# Patient Record
Sex: Female | Born: 1990 | Race: Black or African American | Hispanic: No | Marital: Single | Smoking: Never smoker
Health system: Southern US, Community
[De-identification: ages and names within clinical notes are randomized; demographics above are authoritative.]

## PROBLEM LIST (undated history)

## (undated) DIAGNOSIS — D649 Anemia, unspecified: Secondary | ICD-10-CM

## (undated) DIAGNOSIS — K429 Umbilical hernia without obstruction or gangrene: Secondary | ICD-10-CM

## (undated) DIAGNOSIS — Z789 Other specified health status: Secondary | ICD-10-CM

## (undated) DIAGNOSIS — L259 Unspecified contact dermatitis, unspecified cause: Secondary | ICD-10-CM

## (undated) HISTORY — DX: Unspecified contact dermatitis, unspecified cause: L25.9

## (undated) HISTORY — PX: OTHER SURGICAL HISTORY: SHX169

## (undated) HISTORY — DX: Other specified health status: Z78.9

## (undated) HISTORY — DX: Anemia, unspecified: D64.9

---
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
## 2008-02-25 ENCOUNTER — Ambulatory Visit: Payer: Self-pay | Admitting: Family Medicine

## 2008-03-15 ENCOUNTER — Ambulatory Visit: Payer: Self-pay | Admitting: Family Medicine

## 2008-03-15 DIAGNOSIS — N912 Amenorrhea, unspecified: Secondary | ICD-10-CM | POA: Insufficient documentation

## 2008-06-27 ENCOUNTER — Emergency Department (HOSPITAL_COMMUNITY): Admission: EM | Admit: 2008-06-27 | Discharge: 2008-06-28 | Payer: Self-pay | Admitting: *Deleted

## 2008-08-08 ENCOUNTER — Ambulatory Visit: Payer: Self-pay | Admitting: Family Medicine

## 2008-08-08 ENCOUNTER — Encounter: Payer: Self-pay | Admitting: Family Medicine

## 2008-08-10 ENCOUNTER — Encounter: Payer: Self-pay | Admitting: Family Medicine

## 2008-09-18 ENCOUNTER — Telehealth: Payer: Self-pay | Admitting: Family Medicine

## 2008-09-22 ENCOUNTER — Ambulatory Visit: Payer: Self-pay | Admitting: Family Medicine

## 2008-09-22 ENCOUNTER — Encounter: Payer: Self-pay | Admitting: Family Medicine

## 2008-09-22 LAB — CONVERTED CEMR LAB
Chlamydia, DNA Probe: NEGATIVE
Whiff Test: NEGATIVE

## 2008-09-25 ENCOUNTER — Encounter: Payer: Self-pay | Admitting: Family Medicine

## 2009-04-17 ENCOUNTER — Ambulatory Visit: Payer: Self-pay | Admitting: Family Medicine

## 2009-04-17 DIAGNOSIS — L708 Other acne: Secondary | ICD-10-CM

## 2009-06-15 ENCOUNTER — Telehealth: Payer: Self-pay | Admitting: Family Medicine

## 2009-06-15 ENCOUNTER — Ambulatory Visit: Payer: Self-pay | Admitting: Family Medicine

## 2009-07-25 ENCOUNTER — Ambulatory Visit: Payer: Self-pay | Admitting: Family Medicine

## 2009-08-24 ENCOUNTER — Ambulatory Visit: Payer: Self-pay | Admitting: Family Medicine

## 2009-08-24 ENCOUNTER — Encounter: Payer: Self-pay | Admitting: Family Medicine

## 2009-08-24 LAB — CONVERTED CEMR LAB: Chlamydia, DNA Probe: NEGATIVE

## 2009-08-27 ENCOUNTER — Encounter: Payer: Self-pay | Admitting: Family Medicine

## 2009-08-29 ENCOUNTER — Encounter: Payer: Self-pay | Admitting: Family Medicine

## 2009-09-21 ENCOUNTER — Encounter: Payer: Self-pay | Admitting: *Deleted

## 2009-09-24 ENCOUNTER — Ambulatory Visit: Payer: Self-pay | Admitting: Family Medicine

## 2009-09-24 DIAGNOSIS — L259 Unspecified contact dermatitis, unspecified cause: Secondary | ICD-10-CM

## 2009-09-24 HISTORY — DX: Unspecified contact dermatitis, unspecified cause: L25.9

## 2009-11-21 ENCOUNTER — Ambulatory Visit: Payer: Self-pay | Admitting: Family Medicine

## 2010-06-03 ENCOUNTER — Encounter: Payer: Self-pay | Admitting: Family Medicine

## 2010-06-03 ENCOUNTER — Ambulatory Visit: Payer: Self-pay | Admitting: Family Medicine

## 2010-07-09 ENCOUNTER — Encounter: Payer: Self-pay | Admitting: Family Medicine

## 2010-08-26 ENCOUNTER — Ambulatory Visit: Admit: 2010-08-26 | Payer: Self-pay

## 2010-09-03 NOTE — Letter (Signed)
Summary: Generic Letter  [facility]  [address]   [place], [place] [address]   Phone: [phone]  Fax: [phone]    08/27/2009  [facility] [address] [place], [place]  [address]  Dear Ms. Brisky,  I am happy to inform you that all of your STD testing was negative.  You do not have Gonorrhea, Chlamydia, HIV, or syphillis.  If you have any questions, please call our office.          Sincerely,   Ardeen Garland  MD  Appended Document: Generic Letter mailed.

## 2010-09-03 NOTE — Miscellaneous (Signed)
Summary: no show  Clinical Lists Changes no show.Loralee Pacas CMA  September 21, 2009 10:23 AM

## 2010-09-03 NOTE — Assessment & Plan Note (Signed)
Summary: yeast infection,tcb   Vital Signs:  Patient profile:   20 year old female Height:      66 inches Weight:      163 pounds BMI:     26.40 Temp:     97.9 degrees F oral Pulse rate:   91 / minute BP sitting:   105 / 70  (left arm) Cuff size:   regular  Vitals Entered By: Tessie Fass CMA (November 21, 2009 9:53 AM) CC: yeast infection? Is Patient Diabetic? No Pain Assessment Patient in pain? no        Primary Care Provider:  Ardeen Garland  MD  CC:  yeast infection?.  History of Present Illness: Here for 2 reasons: 1) heavyand frequent menses for last month.  Has implanon.  Due to be removed 8/11.  Doesn't think she wants another implanon.  Thinks she might like to have the mirena but wants more information toread on it.  2) Bump in groin area - since she shaved a few weeks ago, had a bump come up in suprapubic area.  Scratched at it once and it bled.  Still there.  Hsa not had any "white stuff" come out of it.  Not painful.  Just wants to make sure it is okay.   Habits & Providers  Alcohol-Tobacco-Diet     Tobacco Status: never  Allergies: No Known Drug Allergies  Physical Exam  General:  alert, oriented, NAD vitals reviewed Skin:  very small pustule, <.5 cm, at base of pubic hair in upper, center suprapubic region.  No fluctuance, induration, or surrounding erythema   Impression & Recommendations:  Problem # 1:  CARBUNCLE AND FURUNCLE OF UNSPECIFIED SITE (ICD-680.9) Assessment New  Very small hair pustule from shaving.  No indication for antibiotics.  Apply warm compresses.  REturn if it enlarges, becomes tneder or inflamed.   Orders: [facility]- Est Level  3 ([address])  Problem # 2:  Birth Control Assessment: Comment Only Discussed other options.  INterested in Mirena.  GAve info.  Has no insurance.  Discussed [employer] scholarship.  If she decides she is SURE she wants a mirena, she will return to [facility]. She is aware of insertion fee.  Will  schedule in women's clinic if she decides she wants implanon removed and mirena inserted.   Complete Medication List: 1)  Flonase 50 Mcg/act Susp (Fluticasone propionate) .... 2 sprays each nostril daily 2)  Clindamycin Phosphate 1 % Lotn (Clindamycin phosphate) .... Apply small layer to face twice daily disp: 60 g 3)  Differin 0.1 % Lotn (Adapalene) .... Apply thin layer to face twice daily disp: 60 g 4)  Triamcinolone Acetonide 0.1 % Crea (Triamcinolone acetonide) .... Apply to rash on neck two times a day.  stop when rash clears up; dispense one large tube

## 2010-09-03 NOTE — Miscellaneous (Signed)
Summary: Procedures Consent  Procedures Consent   Imported By: De Nurse 06/13/2010 11:38:21  _____________________________________________________________________  External Attachment:    Type:   Image     Comment:   External Document

## 2010-09-03 NOTE — Assessment & Plan Note (Signed)
Summary: implanon removal and reinsertion   Vital Signs:  Patient profile:   20 year old female Weight:      151.7 pounds Temp:     98.6 degrees F oral Pulse rate:   79 / minute Pulse rhythm:   regular BP sitting:   107 / 69  (right arm) Cuff size:   regular  Vitals Entered By: Ellin Mayhew MD (June 03, 2010 10:52 AM) CC: implanon removal   Primary Care Provider:  Ardeen Garland  MD  CC:  implanon removal.  History of Present Illness: Pt here for implanon removal and reinsertion.  implanon easily palpable in left medial upper arm.  Habits & Providers  Alcohol-Tobacco-Diet     Alcohol drinks/day: 0     Tobacco Status: never  Exercise-Depression-Behavior     Does Patient Exercise: yes     Exercise Counseling: to improve exercise regimen     Type of exercise: treamill, weight lifting     Exercise (avg: min/session): >60     Times/week: 6     Have you felt down or hopeless? no     Have you felt little pleasure in things? no     Depression Counseling: not indicated; screening negative for depression     Seat Belt Use: always  Current Medications (verified): 1)  Flonase 50 Mcg/act  Susp (Fluticasone Propionate) .... 2 Sprays Each Nostril Daily 2)  Clindamycin Phosphate 1 % Lotn (Clindamycin Phosphate) .... Apply Small Layer To Face Twice Daily Disp: 60 G 3)  Differin 0.1 % Lotn (Adapalene) .... Apply Thin Layer To Face Twice Daily Disp: 60 G 4)  Triamcinolone Acetonide 0.1 % Crea (Triamcinolone Acetonide) .... Apply To Rash On Neck Two Times A Day.  Stop When Rash Clears Up; Dispense One Large Tube  Allergies (verified): No Known Drug Allergies  Social History: Risk analyst Use:  always  Review of Systems       no fever. no pain in right arm.  no redness in upper right arm.  Physical Exam  General:  VSS  Well-developed,well-nourished,in no acute distress; alert,appropriate and cooperative throughout examination Extremities:  No clubbing, cyanosis, edema, or  deformity noted with normal full range of motion of all joints.   Skin:  Intact without suspicious lesions or rashes Additional Exam:  Consent signed, time out performed,  pt laying supine on exam table.  Arm resting on arm board. implanon easily palable in left upper medial arm.  area at distal tip of implanon numbed using injectable lidocaine,   after sensation tested a lateral incision was made at distal tip of implanon after minimal dissection around tip of implanon, it was removed using forceps implanon measured at 4 cm at time of removal. removal followed by insertion.   Since minimal scarring, placed implanon using same incision site that was used for removal. implanon placed easily. confirmed placement with palpation, pt also palpated site of placement to confirm placement of implanon. minimal blood loss- approx 1-2cc steristrip applied, pressure dressing placed over steristrips.    Impression & Recommendations:  Problem # 1:  CONTRACEPTIVE MANAGEMENT (ICD-V25.09)  implanon removal and reinsertion of new implanon.  Gave pt red flags to return: redness at site, bleeding at site, pain at site, fever, or any new  symptoms.  Pt to return as needed.  Pt is to call if any questions or concerns.   Orders: Removal and reinsert of  Implanon or Norplant device ([address]2423)  Complete Medication List: 1)  Flonase 50 Mcg/act Susp (  Fluticasone propionate) .... 2 sprays each nostril daily 2)  Clindamycin Phosphate 1 % Lotn (Clindamycin phosphate) .... Apply small layer to face twice daily disp: 60 g 3)  Differin 0.1 % Lotn (Adapalene) .... Apply thin layer to face twice daily disp: 60 g 4)  Triamcinolone Acetonide 0.1 % Crea (Triamcinolone acetonide) .... Apply to rash on neck two times a day.  stop when rash clears up; dispense one large tube   Orders Added: 1)  Removal and reinsert of  Implanon or Norplant device [[address]]

## 2010-09-03 NOTE — Assessment & Plan Note (Signed)
Summary: neck broke out in bumps,tcb   Vital Signs:  Patient profile:   20 year old female Weight:      174.2 pounds Temp:     97.6 degrees F oral Pulse rate:   69 / minute Pulse rhythm:   regular BP sitting:   102 / 69  (right arm) Cuff size:   regular  Vitals Entered By: Loralee Pacas CMA (September 24, 2009 9:15 AM) CC: rash on neck Comments bumps on neck x 2wks    Primary Care Provider:  Ardeen Garland  MD  CC:  rash on neck.  History of Present Illness: 1.  rash on neck for 2 weeks--started as small bumps.  itchy.  bumps improved, but still darkened area and a little itchy.  does not have hx of eczema.  did not put any medicine or lotion.  no exposure to new lotions, soaps, or other irritants.  uses dove soap.    Current Medications (verified): 1)  Flonase 50 Mcg/act  Susp (Fluticasone Propionate) .... 2 Sprays Each Nostril Daily 2)  Clindamycin Phosphate 1 % Lotn (Clindamycin Phosphate) .... Apply Small Layer To Face Twice Daily Disp: 60 G 3)  Differin 0.1 % Lotn (Adapalene) .... Apply Thin Layer To Face Twice Daily Disp: 60 G  Allergies: No Known Drug Allergies  Review of Systems General:  Denies fever. Derm:  Complains of changes in color of skin and itching; denies dryness.  Physical Exam  General:  Well-developed,well-nourished,in no acute distress; alert,appropriate and cooperative throughout examination Skin:  patches of eczematous rash on neck Additional Exam:  vital signs reviewed    Impression & Recommendations:  Problem # 1:  ECZEMA (ICD-692.9) Assessment New  could also be contact dermatitis, but no new exposures.  it is mild.  treat with triamcinolone 0.1% cream, which is on [facility] formulary.  also advised gentle soap and moisturize.  Orders: [facility]- Est Level  3 ([address])  Her updated medication list for this problem includes:    Triamcinolone Acetonide 0.1 % Crea (Triamcinolone acetonide) .Marland Kitchen... Apply to rash on neck two times a day.  stop when rash  clears up; dispense one large tube  Complete Medication List: 1)  Flonase 50 Mcg/act Susp (Fluticasone propionate) .... 2 sprays each nostril daily 2)  Clindamycin Phosphate 1 % Lotn (Clindamycin phosphate) .... Apply small layer to face twice daily disp: 60 g 3)  Differin 0.1 % Lotn (Adapalene) .... Apply thin layer to face twice daily disp: 60 g 4)  Triamcinolone Acetonide 0.1 % Crea (Triamcinolone acetonide) .... Apply to rash on neck two times a day.  stop when rash clears up; dispense one large tube  Patient Instructions: 1)  It was nice to see you today.  2)  I think you have eczema. I prescribed you a new cream called triamcinolone.  Put this cream on your neck twice a day until the rash clears up.  Put the  cream on BEFORE putting lotion on. 3)  Other things you can do to help with eczema are:  bathing every other day.  Putting lotion (eucerin or vaseline) on her every day.  Keep using [employer]. 4)  Please schedule a follow-up appointment as needed .  Prescriptions: TRIAMCINOLONE ACETONIDE 0.1 % CREA (TRIAMCINOLONE ACETONIDE) apply to rash on neck two times a day.  stop when rash clears up; dispense one large tube  #1 x 2   Entered and Authorized by:   Asher Muir MD   Signed by:   Darl Pikes  Lafonda Mosses MD on 09/24/2009   Method used:   Print then Give to Patient   RxID:   [id number]

## 2010-09-03 NOTE — Miscellaneous (Signed)
  Clinical Lists Changes  Problems: Removed problem of CONTRACEPTIVE MANAGEMENT (ICD-V25.09) Removed problem of CARBUNCLE AND FURUNCLE OF UNSPECIFIED SITE (ICD-680.9) Removed problem of VAGINAL DISCHARGE (ICD-623.5) Removed problem of VAGINITIS, CANDIDAL (ICD-112.1) Removed problem of CONTACT OR EXPOSURE TO OTHER VIRAL DISEASES (ICD-V01.79) Removed problem of NEED PROPH VAC W/COMB DIPHTH-TETANUS-PERTUSS VAC (ICD-V06.1) Removed problem of VAGINITIS (ICD-616.10) Removed problem of GYNECOLOGICAL EXAMINATIONOUTINE (ICD-V72.31) Removed problem of SCREENING FOR MALIGNANT NEOPLASM(ICD-V76.2) Removed problem of RHINITIS (ICD-477.9)      Allergies: No Known Drug Allergies

## 2010-09-03 NOTE — Letter (Signed)
Summary: Generic Letter  [facility]  [address]   [place], [place] [address]   Phone: [phone]  Fax: [phone]    08/29/2009  [facility] [address] [place], [place]  [address]  Dear Ms. Bringhurst,  I am happy to inform you that your pap smear was normal.  We will repeat it again in 1 year.  If you have any questions, please call our office.         Sincerely,   Ardeen Garland  MD  Appended Document: Generic Letter mailed.  Appended Document: Generic Letter mailed.

## 2010-09-03 NOTE — Assessment & Plan Note (Signed)
Summary: cpe,df   Vital Signs:  Patient profile:   20 year old female Height:      66 inches Weight:      174 pounds BMI:     28.19 Temp:     97.5 degrees F oral Pulse rate:   89 / minute BP sitting:   108 / 64  (right arm) Cuff size:   large  Vitals Entered By: Tessie Fass CMA (August 24, 2009 2:38 PM) CC: complete physical with pap Is Patient Diabetic? No Pain Assessment Patient in pain? no        Primary Care Provider:  Ardeen Garland  MD  CC:  complete physical with pap.  History of Present Illness: Adam comes in today for physical with pap.  She has no complaints.  SHe had LSIL on pap in 2008.  Repeat pap in 2010 was normal.  Was not sent for colpo at that time as she was <18yo.  She has implanon for birth control.  Place din 8/08.  Due for removal/replacement 8/11.  Patient aware.   No complaints today.  Woudl like full STD check.   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  Sexually active- 4 partners total.  No Tobacco, Rare EtOH, no drugs  Physical Exam  General:  thin, alert, NAD, vitals reviewed.  Eyes:  conjunctiva clear and moist, no injection Ears:  bilateral TMs normal Mouth:  OP clear and moist Neck:  No deformities, masses, or tenderness noted. Lungs:  Normal respiratory effort, chest expands symmetrically. Lungs are clear to auscultation, no crackles or wheezes. Heart:  Normal rate and regular rhythm. S1 and S2 normal  without gallop, murmur, click, rub or other extra sounds. Abdomen:  Bowel sounds positive,abdomen soft and non-tender without masses, organomegaly or hernias noted. Genitalia:  normal introitus, no external lesions, no vaginal discharge, mucosa pink and moist, no vaginal or cervical lesions, no vaginal atrophy, no friaility or hemorrhage, normal uterus size and position, and no adnexal masses or tenderness.  Pt is on her menses.  Blood in vaginal vault.  Pulses:  2+ radial and dp pulses Extremities:  no edema   Impression & Recommendations:  Problem # 1:  GYNECOLOGICAL EXAMINATIONOUTINE (ICD-V72.31) Assessment Unchanged Pap obtained as well as GC/Chlamyida, HIV, RPR  Complete Medication List: 1)  Flonase 50 Mcg/act Susp (Fluticasone propionate) .... 2 sprays each nostril daily 2)  Clindamycin Phosphate 1 % Lotn (Clindamycin phosphate) .... Apply small layer to face twice daily disp: 60 g 3)  Differin 0.1 % Lotn (Adapalene) .... Apply thin layer to face twice daily disp: 60 g 4)  Ibuprofen 800 Mg Tabs (Ibuprofen) .Marland Kitchen.. 1 tab by mouth three times a day with food for 10 days then as needed three times a day as needed for pain  Other Orders: Pap Smear-[facility] ([address]) GC/Chlamydia-[facility] ([address]) HIV-[facility] ([id number]) RPR-[facility] [id number]) [facility] - Est  18-39 yrs ([address])  Patient Instructions: 1)  It was great to see you today. 2)  I will mail you a letter  with the results of your tests and your pap smear. 3)  Your implanon was put in August 2008 so it needs to be removed and replaced in August of this year.  They do that at the health department.  We do not do it here.  4)  Please return in 1 year or sooner if needed.

## 2010-09-10 ENCOUNTER — Encounter: Payer: Self-pay | Admitting: *Deleted

## 2010-09-18 ENCOUNTER — Other Ambulatory Visit: Payer: Self-pay | Admitting: Family Medicine

## 2010-09-18 ENCOUNTER — Encounter: Payer: Self-pay | Admitting: Family Medicine

## 2010-09-18 ENCOUNTER — Ambulatory Visit (INDEPENDENT_AMBULATORY_CARE_PROVIDER_SITE_OTHER): Payer: Self-pay | Admitting: Family Medicine

## 2010-09-18 VITALS — BP 104/68 | HR 76 | Temp 98.4°F | Ht 65.0 in | Wt 158.3 lb

## 2010-09-18 DIAGNOSIS — Z0000 Encounter for general adult medical examination without abnormal findings: Secondary | ICD-10-CM | POA: Insufficient documentation

## 2010-09-18 DIAGNOSIS — N898 Other specified noninflammatory disorders of vagina: Secondary | ICD-10-CM

## 2010-09-18 DIAGNOSIS — Z309 Encounter for contraceptive management, unspecified: Secondary | ICD-10-CM

## 2010-09-18 LAB — POCT WET PREP (WET MOUNT)
Clue Cells Wet Prep HPF POC: NEGATIVE
KOH Wet Prep POC: NEGATIVE
Trichomonas Wet Prep HPF POC: NEGATIVE
Yeast Wet Prep HPF POC: NEGATIVE

## 2010-09-18 NOTE — Progress Notes (Signed)
  Subjective:    Patient ID: Amy Greene, female    DOB: 1991/03/28, 20 y.o.   MRN: [id number]  HPI Pt here for pap smear:  Last sexual encounter- last 12/2009.  Always uses protection.  Would still like to get checked for Gc/Chlam and HIV/Syphilus.   No sexual partner since 5/11.    No other complaints or concerns that she would like to discuss today.  Denies any breast lumps or nodules or worrisome area on self breast exam.   Review of Systems  Constitutional: Negative.   HENT: Negative.   Eyes: Negative.   Respiratory: Negative for cough, shortness of breath and wheezing.   Cardiovascular: Negative.   Musculoskeletal: Negative.        Objective:   Physical Exam  Constitutional: She appears well-developed and well-nourished.  HENT:  Head: Normocephalic and atraumatic.  Eyes: Conjunctivae and EOM are normal.  Cardiovascular: Normal rate, regular rhythm and normal heart sounds.   Pulmonary/Chest: Effort normal and breath sounds normal. No respiratory distress. She has no wheezes.  Abdominal: Soft. Bowel sounds are normal.  Genitourinary: Vagina normal and uterus normal. Pelvic exam was performed with patient supine. There is no rash or lesion on the right labia. There is no rash or lesion on the left labia. Uterus is not enlarged and not tender. Cervix exhibits no motion tenderness and no friability. Right adnexum displays no mass, no tenderness and no fullness. Left adnexum displays no mass, no tenderness and no fullness.          Assessment & Plan:

## 2010-09-18 NOTE — Patient Instructions (Signed)
Return in 1 year for follow up appt or sooner if needed.

## 2010-09-19 ENCOUNTER — Encounter: Payer: Self-pay | Admitting: Family Medicine

## 2010-09-19 LAB — GC/CHLAMYDIA PROBE AMP, GENITAL: Chlamydia, DNA Probe: NEGATIVE

## 2010-09-20 ENCOUNTER — Encounter: Payer: Self-pay | Admitting: Family Medicine

## 2010-09-22 ENCOUNTER — Encounter: Payer: Self-pay | Admitting: Family Medicine

## 2010-09-22 DIAGNOSIS — Z309 Encounter for contraceptive management, unspecified: Secondary | ICD-10-CM | POA: Insufficient documentation

## 2010-09-22 NOTE — Assessment & Plan Note (Signed)
Pt is pleased with implanon- recently removed and new implanon reinserted on 06/03/10.

## 2010-09-22 NOTE — Assessment & Plan Note (Addendum)
Pap smear, DNA prob obtained.  Will send results via letter to patient.  Discussed safe sex practices with patient.  Pt to return in 1 year for annual exam.  Implanon for birth control- placed 06/03/10.

## 2011-04-23 ENCOUNTER — Emergency Department (HOSPITAL_COMMUNITY): Payer: Self-pay

## 2011-04-23 ENCOUNTER — Emergency Department (HOSPITAL_COMMUNITY)
Admission: EM | Admit: 2011-04-23 | Discharge: 2011-04-23 | Disposition: A | Discharge status: Home / Self Care | Payer: Self-pay | Attending: Emergency Medicine | Admitting: Emergency Medicine

## 2011-04-23 DIAGNOSIS — S61409A Unspecified open wound of unspecified hand, initial encounter: Secondary | ICD-10-CM | POA: Insufficient documentation

## 2011-04-23 DIAGNOSIS — W268XXA Contact with other sharp object(s), not elsewhere classified, initial encounter: Secondary | ICD-10-CM | POA: Insufficient documentation

## 2011-04-23 DIAGNOSIS — M79609 Pain in unspecified limb: Secondary | ICD-10-CM | POA: Insufficient documentation

## 2011-05-09 ENCOUNTER — Inpatient Hospital Stay (INDEPENDENT_AMBULATORY_CARE_PROVIDER_SITE_OTHER)
Admission: RE | Admit: 2011-05-09 | Discharge: 2011-05-09 | Disposition: A | Discharge status: Home / Self Care | Payer: Self-pay | Source: Ambulatory Visit | Attending: Family Medicine | Admitting: Family Medicine

## 2011-05-09 DIAGNOSIS — S61409A Unspecified open wound of unspecified hand, initial encounter: Secondary | ICD-10-CM

## 2011-05-09 DIAGNOSIS — S61209A Unspecified open wound of unspecified finger without damage to nail, initial encounter: Secondary | ICD-10-CM

## 2011-05-09 DIAGNOSIS — Z4802 Encounter for removal of sutures: Secondary | ICD-10-CM

## 2011-06-23 ENCOUNTER — Encounter (HOSPITAL_COMMUNITY): Payer: Self-pay | Admitting: Emergency Medicine

## 2011-06-23 ENCOUNTER — Emergency Department (INDEPENDENT_AMBULATORY_CARE_PROVIDER_SITE_OTHER)
Admission: EM | Admit: 2011-06-23 | Discharge: 2011-06-23 | Disposition: A | Discharge status: Home / Self Care | Payer: Self-pay | Source: Home / Self Care | Attending: Emergency Medicine | Admitting: Emergency Medicine

## 2011-06-23 DIAGNOSIS — M719 Bursopathy, unspecified: Secondary | ICD-10-CM

## 2011-06-23 DIAGNOSIS — M6790 Unspecified disorder of synovium and tendon, unspecified site: Secondary | ICD-10-CM

## 2011-06-23 NOTE — ED Provider Notes (Signed)
History     CSN: [id number] Arrival date & time: 06/23/2011  8:09 PM   First MD Initiated Contact with Patient 06/23/11 1946      Chief Complaint  Patient presents with  . Finger Injury  . Re-evaluation    (Consider location/radiation/quality/duration/timing/severity/associated sxs/prior treatment) HPI Comments: I can't seem to move this finger very well" " and its still swollen and tender in all this area" (see illustration)  The history is provided by the patient.    History reviewed. No pertinent past medical history.  History reviewed. No pertinent past surgical history.  History reviewed. No pertinent family history.  History  Substance Use Topics  . Smoking status: Never Smoker   . Smokeless tobacco: Not on file  . Alcohol Use: No    OB History    Grav Para Term Preterm Abortions TAB SAB Ect Mult Living                  Review of Systems  Constitutional: Negative for fever.  Neurological: Positive for weakness. Negative for numbness.    Allergies  Review of patient's allergies indicates no known allergies.  Home Medications   Current Outpatient Rx  Name Route Sig Dispense Refill  . ADAPALENE 0.1 % EX LOTN  Apply thin layer to face twice daily disp: 60 g     . CLINDAMYCIN PHOSPHATE 1 % EX LOTN  Apply small layer to face twice daily disp: 60 g (clindamycin phosphate 1% lotn)     . FLUTICASONE PROPIONATE 50 MCG/ACT NA SUSP  2 sprays daily. In each nostril     . TRIAMCINOLONE ACETONIDE 0.1 % EX CREA Topical Apply topically 2 (two) times daily. To rash on neck. Stop when rash clears up; dispense one large tube       BP 109/71  Pulse 97  Temp(Src) 98.3 F (36.8 C) (Oral)  Resp 18  SpO2 100%  Physical Exam  Nursing note and vitals reviewed. Constitutional: She appears well-developed and well-nourished.  Musculoskeletal:       Hands: Skin: Skin is warm.    ED Course  Procedures (including critical care time)  Labs Reviewed - No data to  display No results found.   1. Tendon dysfunction       MDM  Suspected extensor Injury- sustained 04/13/2011 Limited ROM 5th digit- acute Follow-up with hand orthoepdic provider        Jimmie Molly, MD 06/23/11 2043

## 2011-06-23 NOTE — ED Notes (Signed)
Pt here for right hand pinky finger re evaluation post laceration and repair in September 2012.pt was here in October for staple removal but states digit is pronounced and pain with rom and hand grasp.no c/o numb/tingling noted.xrays neg for tendon or artery damage.

## 2011-06-24 ENCOUNTER — Telehealth (HOSPITAL_COMMUNITY): Payer: Self-pay | Admitting: *Deleted

## 2011-06-24 NOTE — ED Notes (Addendum)
Medical follow-up request form received from Dr. Ladon Applebaum for referral to Dr. Rayburn Ma for f/u of extensor tendon injury R 5th digit for laceration hand 04/23/11.  Pt. was sutured at [facility] ED 9/19.  I called Dr. Doroteo Glassman office and they said if pt. Has no insurance it would go back to the doctor on call for the [facility] ED 9/19 (SOS). Vassie Moselle 06/24/2011

## 2011-06-24 NOTE — ED Notes (Signed)
Talked with Amy Greene at [facility] and appt. set up for 11/26 @ 1545.  They will send pt. a packet of papers to fill out. If she does not receive them, she must arrive 30 min. early to do paperwork.  She must make some sort of payment. Amy Greene 06/24/2011

## 2011-07-30 ENCOUNTER — Encounter (HOSPITAL_COMMUNITY): Payer: Self-pay | Admitting: Emergency Medicine

## 2011-07-30 ENCOUNTER — Emergency Department (HOSPITAL_COMMUNITY): Payer: Self-pay

## 2011-07-30 ENCOUNTER — Emergency Department (HOSPITAL_COMMUNITY)
Admission: EM | Admit: 2011-07-30 | Discharge: 2011-07-31 | Discharge status: Against Medical Advice | Payer: Self-pay | Attending: Emergency Medicine | Admitting: Emergency Medicine

## 2011-07-30 DIAGNOSIS — M25569 Pain in unspecified knee: Secondary | ICD-10-CM | POA: Insufficient documentation

## 2011-07-30 NOTE — ED Notes (Signed)
PT. REPORTS RIGHT KNEE PAIN WHILE RUNNING THIS EVENING ,  DENIES FALL , STATES SHE WAS WORKING OUT.

## 2011-07-31 ENCOUNTER — Telehealth (HOSPITAL_COMMUNITY): Payer: Self-pay | Admitting: *Deleted

## 2011-07-31 ENCOUNTER — Emergency Department (INDEPENDENT_AMBULATORY_CARE_PROVIDER_SITE_OTHER)
Admission: EM | Admit: 2011-07-31 | Discharge: 2011-07-31 | Disposition: A | Discharge status: Home / Self Care | Payer: Self-pay | Source: Home / Self Care | Attending: Family Medicine | Admitting: Family Medicine

## 2011-07-31 ENCOUNTER — Encounter (HOSPITAL_COMMUNITY): Payer: Self-pay

## 2011-07-31 DIAGNOSIS — IMO0002 Reserved for concepts with insufficient information to code with codable children: Secondary | ICD-10-CM

## 2011-07-31 DIAGNOSIS — S8391XA Sprain of unspecified site of right knee, initial encounter: Secondary | ICD-10-CM

## 2011-07-31 MED ORDER — IBUPROFEN 600 MG PO TABS: 600.0000 mg | ORAL_TABLET | Freq: Three times a day (TID) | ORAL | Status: AC | PRN

## 2011-07-31 MED ORDER — TRAMADOL HCL 50 MG PO TABS: 50.0000 mg | ORAL_TABLET | Freq: Three times a day (TID) | ORAL | Status: AC | PRN

## 2011-07-31 NOTE — ED Notes (Signed)
Pt states she kicked at a dog yesterday afternoon and heard a pop in her rt knee.  States knee continues to hurt.  She went to ED last night for evaluation and ended up leaving before treatment was completed.  Here because continues to be painful, reports unable to bear weight on rt knee.

## 2011-07-31 NOTE — ED Provider Notes (Signed)
History     CSN: [id number]  Arrival date & time 07/31/11  1128   First MD Initiated Contact with Patient 07/31/11 1234      Chief Complaint  Patient presents with  . Knee Injury    (Consider location/radiation/quality/duration/timing/severity/associated sxs/prior treatment) HPI Comments: Otherwise healthy here c/o right knee pain since yesterday afternoon when she was walking her dog and another stray dog was charging at them she tried to kick the attacking dog and hyperextended her knee, "heard a pop sound and felt pain in the right" knee was not able to bear weight on her right leg due to pain since. Was at [facility] yesterday had X-rays done, but left before examination due to long waiting time. No swelling or redness.    History reviewed. No pertinent past medical history.  History reviewed. No pertinent past surgical history.  No family history on file.  History  Substance Use Topics  . Smoking status: Never Smoker   . Smokeless tobacco: Not on file  . Alcohol Use: No    OB History    Grav Para Term Preterm Abortions TAB SAB Ect Mult Living                  Review of Systems  Musculoskeletal: Positive for arthralgias and gait problem. Negative for joint swelling.  All other systems reviewed and are negative.    Allergies  Review of patient's allergies indicates no known allergies.  Home Medications   Current Outpatient Rx  Name Route Sig Dispense Refill  . IBUPROFEN 600 MG PO TABS Oral Take 1 tablet (600 mg total) by mouth every 8 (eight) hours as needed for pain. 30 tablet 0  . TRAMADOL HCL 50 MG PO TABS Oral Take 1 tablet (50 mg total) by mouth every 8 (eight) hours as needed for pain. Maximum dose= 8 tablets per day 20 tablet 0    BP 115/70  Pulse 88  Temp(Src) 97.9 F (36.6 C) (Oral)  Resp 18  SpO2 100%  Physical Exam  Nursing note and vitals reviewed. Constitutional: She appears well-developed and well-nourished. No distress.  Cardiovascular:  Normal heart sounds.   Pulmonary/Chest: Breath sounds normal.  Musculoskeletal:       Right knee: She exhibits decreased range of motion. She exhibits no swelling, no effusion, no ecchymosis, no deformity, no laceration, no erythema, normal alignment, no LCL laxity, normal patellar mobility, no bony tenderness and no MCL laxity. tenderness found. Lateral joint line tenderness noted. No patellar tendon tenderness noted.       Legs:   ED Course  Procedures (including critical care time)  Labs Reviewed - No data to display Dg Knee Complete 4 Views Right  07/30/2011  *RADIOLOGY REPORT*  Clinical Data: Para oral GI pain.  RIGHT KNEE - COMPLETE 4+ VIEW  Comparison: None.  Findings: No fracture or dislocation.  No plain film evidence of stress injury.  IMPRESSION: No fracture or dislocation.  Original Report Authenticated By: Fuller Canada, M.D.     1. Right knee sprain       MDM  Knee immobilizer, crutches, NSAIDs and rehab exercises reccommended. Follow up with ortho or sports medicine if persistent symptoms after 1 week.        Sharin Grave, MD 08/01/11 1319

## 2011-08-27 ENCOUNTER — Encounter: Payer: Self-pay | Admitting: Family Medicine

## 2011-08-27 ENCOUNTER — Other Ambulatory Visit: Payer: Self-pay | Admitting: Family Medicine

## 2011-08-27 ENCOUNTER — Ambulatory Visit (INDEPENDENT_AMBULATORY_CARE_PROVIDER_SITE_OTHER): Payer: Self-pay | Admitting: Family Medicine

## 2011-08-27 VITALS — BP 125/84 | HR 102 | Ht 65.0 in | Wt 189.0 lb

## 2011-08-27 DIAGNOSIS — S8990XA Unspecified injury of unspecified lower leg, initial encounter: Secondary | ICD-10-CM

## 2011-08-27 DIAGNOSIS — Z23 Encounter for immunization: Secondary | ICD-10-CM

## 2011-08-27 DIAGNOSIS — Z0000 Encounter for general adult medical examination without abnormal findings: Secondary | ICD-10-CM

## 2011-08-27 DIAGNOSIS — Z309 Encounter for contraceptive management, unspecified: Secondary | ICD-10-CM

## 2011-08-27 DIAGNOSIS — S8991XA Unspecified injury of right lower leg, initial encounter: Secondary | ICD-10-CM

## 2011-08-27 DIAGNOSIS — E669 Obesity, unspecified: Secondary | ICD-10-CM

## 2011-08-27 LAB — GLUCOSE, CAPILLARY: Glucose-Capillary: 95 mg/dL (ref 70–99)

## 2011-08-27 NOTE — Patient Instructions (Signed)
Right knee: Physical therapy--will call you for the appointment  Labs: I am going to send the results in the mail.   Weight management goals: Work healthier diet- nutritionist here at [facility] if you are interested.  Schedule appointment with nutritionist-- [facility] 3 x per week for .

## 2011-08-28 ENCOUNTER — Encounter: Payer: Self-pay | Admitting: Family Medicine

## 2011-08-30 ENCOUNTER — Encounter: Payer: Self-pay | Admitting: Family Medicine

## 2011-08-30 DIAGNOSIS — Z6831 Body mass index (BMI) 31.0-31.9, adult: Secondary | ICD-10-CM | POA: Insufficient documentation

## 2011-08-30 DIAGNOSIS — S8991XA Unspecified injury of right lower leg, initial encounter: Secondary | ICD-10-CM | POA: Insufficient documentation

## 2011-08-30 NOTE — Assessment & Plan Note (Signed)
Pt goal is to walk 2-3 x per week for to 1 hour.  Pt goal weight of 135lbs.  Pt would like to meet with nutritionist.  Pt to call and make an appointment.  Pt to return for recheck of weight in 1 month.

## 2011-08-30 NOTE — Assessment & Plan Note (Signed)
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  Will allow pt to go to Pap smear every 2 years.  Pt is reliable and good to f/up.   Next pap smear 09/2012.

## 2011-08-30 NOTE — Assessment & Plan Note (Signed)
implanon in place.  Pt is happy with this form of contraception.  Also advised pt to continue to use condoms.

## 2011-08-30 NOTE — Assessment & Plan Note (Signed)
Significant decreased flexion in right knee.  I think most likely is stiffness 2/2 using knee immobilizer for extended period of time.  Pt to go to PT to regain flexion in knee.

## 2011-08-30 NOTE — Progress Notes (Signed)
  Subjective:    Patient ID: Amy Greene, female    DOB: 11-25-1990, 21 y.o.   MRN: [id number]  HPI Here for annual physical:  PMH, Surgical history, FH, SH, meds and allergies-- updated under appropriate tabs.  No concerns at today's visit.   Weight management: Pt states that she has gained weight recently.  Would like to get into better shape.   Also endorses poor eating habits.  Eating large potions. Would like to meet with a nutritionist. Goal weight 135lbs.  Right knee strain: Diagnosed after fall while walking dog-- dx at urgent care.  Has been using knee immoblizer for the past 3-4 weeks.  No pain in knee.  Knee just feels unstable.  No redness. No swelling.  No pain.   Health maintanence: CIN-1 in 2008-- 3 normal pap smears since then. Pt requestion GC/Chlam testing.   Birth control- implanon   Review of Systems As per above    Objective:   Physical Exam  Constitutional: She appears well-developed.       overweight  HENT:  Head: Normocephalic and atraumatic.  Neck: No thyromegaly present.  Cardiovascular: Normal rate, regular rhythm and normal heart sounds.   No murmur heard. Pulmonary/Chest: Effort normal. No respiratory distress.  Abdominal: Soft. She exhibits no distension. There is no tenderness. There is no rebound.  Musculoskeletal:       Right knee exam: No pain on palpation.  Normal sensation.  Normal strength.  No redness.  No swelling.  Pain with flexion of right knee.  Limited flexion of right knee-- 40 degrees less flexion on right compared to left.  Ligament strength normal- MCL, LCL, PCL, ACL all wnl.    Neurological: She is alert.  Skin: No rash noted.  Psychiatric: She has a normal mood and affect.          Assessment & Plan:

## 2011-09-29 ENCOUNTER — Ambulatory Visit: Payer: Self-pay | Attending: Family Medicine | Admitting: Physical Therapy

## 2011-09-29 DIAGNOSIS — IMO0001 Reserved for inherently not codable concepts without codable children: Secondary | ICD-10-CM | POA: Insufficient documentation

## 2011-09-29 DIAGNOSIS — M25569 Pain in unspecified knee: Secondary | ICD-10-CM | POA: Insufficient documentation

## 2011-09-29 DIAGNOSIS — R5381 Other malaise: Secondary | ICD-10-CM | POA: Insufficient documentation

## 2011-09-29 DIAGNOSIS — M25669 Stiffness of unspecified knee, not elsewhere classified: Secondary | ICD-10-CM | POA: Insufficient documentation

## 2011-09-29 DIAGNOSIS — M6281 Muscle weakness (generalized): Secondary | ICD-10-CM | POA: Insufficient documentation

## 2011-10-03 ENCOUNTER — Emergency Department (INDEPENDENT_AMBULATORY_CARE_PROVIDER_SITE_OTHER)
Admission: EM | Admit: 2011-10-03 | Discharge: 2011-10-03 | Disposition: A | Discharge status: Home Health | Payer: Self-pay | Source: Home / Self Care | Attending: Family Medicine | Admitting: Family Medicine

## 2011-10-03 ENCOUNTER — Encounter (HOSPITAL_COMMUNITY): Payer: Self-pay

## 2011-10-03 DIAGNOSIS — J111 Influenza due to unidentified influenza virus with other respiratory manifestations: Secondary | ICD-10-CM

## 2011-10-03 MED ORDER — ACETAMINOPHEN 325 MG PO TABS
ORAL_TABLET | ORAL | Status: AC
  Filled 2011-10-03: qty 2

## 2011-10-03 MED ORDER — GUAIFENESIN-CODEINE 100-10 MG/5ML PO SYRP: 5.0000 mL | ORAL_SOLUTION | Freq: Three times a day (TID) | ORAL | Status: AC | PRN

## 2011-10-03 MED ORDER — ACETAMINOPHEN 325 MG PO TABS
650.0000 mg | ORAL_TABLET | Freq: Once | ORAL | Status: AC
  Administered 2011-10-03: 650 mg via ORAL

## 2011-10-03 NOTE — Discharge Instructions (Signed)
I recommend aggressive fever control with acetaminophen (Tylenol) and/or ibuprofen. You may use these together, alternating them every 4 hours, or individually, every 8 hours. For example, take acetaminophen 500 to 1000 mg at 12 noon, then 600 to 800 mg of ibuprofen at 4 pm, then acetaminophen at 8 pm, etc. Use the prescribed cough syrup as directed. Rest as needed. Also, stay hydrated with clear liquids. Return to care should your symptoms not improve, or worsen in any way.

## 2011-10-03 NOTE — ED Notes (Signed)
Pt c/o productive cough, body aches, headache, runny nose and congestion since yesterday.

## 2011-10-03 NOTE — ED Provider Notes (Signed)
History     CSN: [id number]  Arrival date & time 10/03/11  1722   First MD Initiated Contact with Patient 10/03/11 1745      Chief Complaint  Patient presents with  . Cough  . Generalized Body Aches    (Consider location/radiation/quality/duration/timing/severity/associated sxs/prior treatment) HPI Comments: Amy Greene presents for evaluation of cough, body aches, nasal congestion, rhinorrhea, over the last 24 hours. She reports fever as well and took medication. Today. Patient is a 21 y.o. female presenting with URI. The history is provided by the patient.  URI The primary symptoms include fever, fatigue, cough and myalgias. Primary symptoms do not include sore throat. The current episode started yesterday. This is a new problem. The problem has not changed since onset. The fever began yesterday. The fever has been unchanged since its onset. The maximum temperature recorded prior to her arrival was unknown.  The fatigue began yesterday. The fatigue has been unchanged since its onset.  The cough began yesterday. The cough is non-productive.  Myalgias began yesterday. The myalgias have been unchanged since their onset. The myalgias are generalized.  Symptoms associated with the illness include chills, congestion and rhinorrhea.    Past Medical History  Diagnosis Date  . ECZEMA 09/24/2009    History reviewed. No pertinent past surgical history.  No family history on file.  History  Substance Use Topics  . Smoking status: Never Smoker   . Smokeless tobacco: Not on file  . Alcohol Use: No     social    OB History    Grav Para Term Preterm Abortions TAB SAB Ect Mult Living                  Review of Systems  Constitutional: Positive for fever, chills and fatigue.  HENT: Positive for congestion and rhinorrhea. Negative for sore throat.   Eyes: Negative.   Respiratory: Positive for cough.   Cardiovascular: Negative.   Gastrointestinal: Negative.   Genitourinary: Negative.    Musculoskeletal: Positive for myalgias.  Skin: Negative.   Neurological: Negative.     Allergies  Review of patient's allergies indicates no known allergies.  Home Medications   Current Outpatient Rx  Name Route Sig Dispense Refill  . GUAIFENESIN-CODEINE 100-10 MG/5ML PO SYRP Oral Take 5 mLs by mouth 3 (three) times daily as needed for cough. 120 mL 0    BP 102/65  Pulse 116  Temp(Src) 100.5 F (38.1 C) (Oral)  Resp 18  SpO2 96%  Physical Exam  Nursing note and vitals reviewed. Constitutional: She is oriented to person, place, and time. She appears well-developed and well-nourished.  HENT:  Head: Normocephalic and atraumatic.  Right Ear: Tympanic membrane normal.  Left Ear: Tympanic membrane normal.  Mouth/Throat: Uvula is midline, oropharynx is clear and moist and mucous membranes are normal.  Eyes: EOM are normal.  Neck: Normal range of motion.  Pulmonary/Chest: Effort normal and breath sounds normal. She has no decreased breath sounds. She has no wheezes. She has no rhonchi.  Musculoskeletal: Normal range of motion.  Neurological: She is alert and oriented to person, place, and time.  Skin: Skin is warm and dry.  Psychiatric: Her behavior is normal.    ED Course  Procedures (including critical care time)  Labs Reviewed - No data to display No results found.   1. Influenza-like illness       MDM  Advised supportive care; given rx for guaifenesin AC        Richardo Priest, MD 10/05/11  1436 

## 2011-10-08 ENCOUNTER — Encounter: Payer: Self-pay | Admitting: Rehabilitation

## 2011-10-10 ENCOUNTER — Encounter: Payer: Self-pay | Admitting: Rehabilitation

## 2011-10-15 ENCOUNTER — Ambulatory Visit: Payer: Self-pay | Attending: Family Medicine | Admitting: Rehabilitation

## 2011-10-15 DIAGNOSIS — M25569 Pain in unspecified knee: Secondary | ICD-10-CM | POA: Insufficient documentation

## 2011-10-15 DIAGNOSIS — M25669 Stiffness of unspecified knee, not elsewhere classified: Secondary | ICD-10-CM | POA: Insufficient documentation

## 2011-10-15 DIAGNOSIS — R5381 Other malaise: Secondary | ICD-10-CM | POA: Insufficient documentation

## 2011-10-15 DIAGNOSIS — M6281 Muscle weakness (generalized): Secondary | ICD-10-CM | POA: Insufficient documentation

## 2011-10-15 DIAGNOSIS — IMO0001 Reserved for inherently not codable concepts without codable children: Secondary | ICD-10-CM | POA: Insufficient documentation

## 2011-10-16 ENCOUNTER — Ambulatory Visit: Payer: Self-pay | Admitting: Family Medicine

## 2011-10-17 ENCOUNTER — Encounter: Payer: Self-pay | Admitting: Rehabilitation

## 2013-12-05 IMAGING — CR DG KNEE COMPLETE 4+V*R*
4 series · 4 of 4 positions shown · non-contrast
Comparison: None.

CLINICAL DATA: Para oral GI pain.

RIGHT KNEE - COMPLETE 4+ VIEW

[t knee ap right]
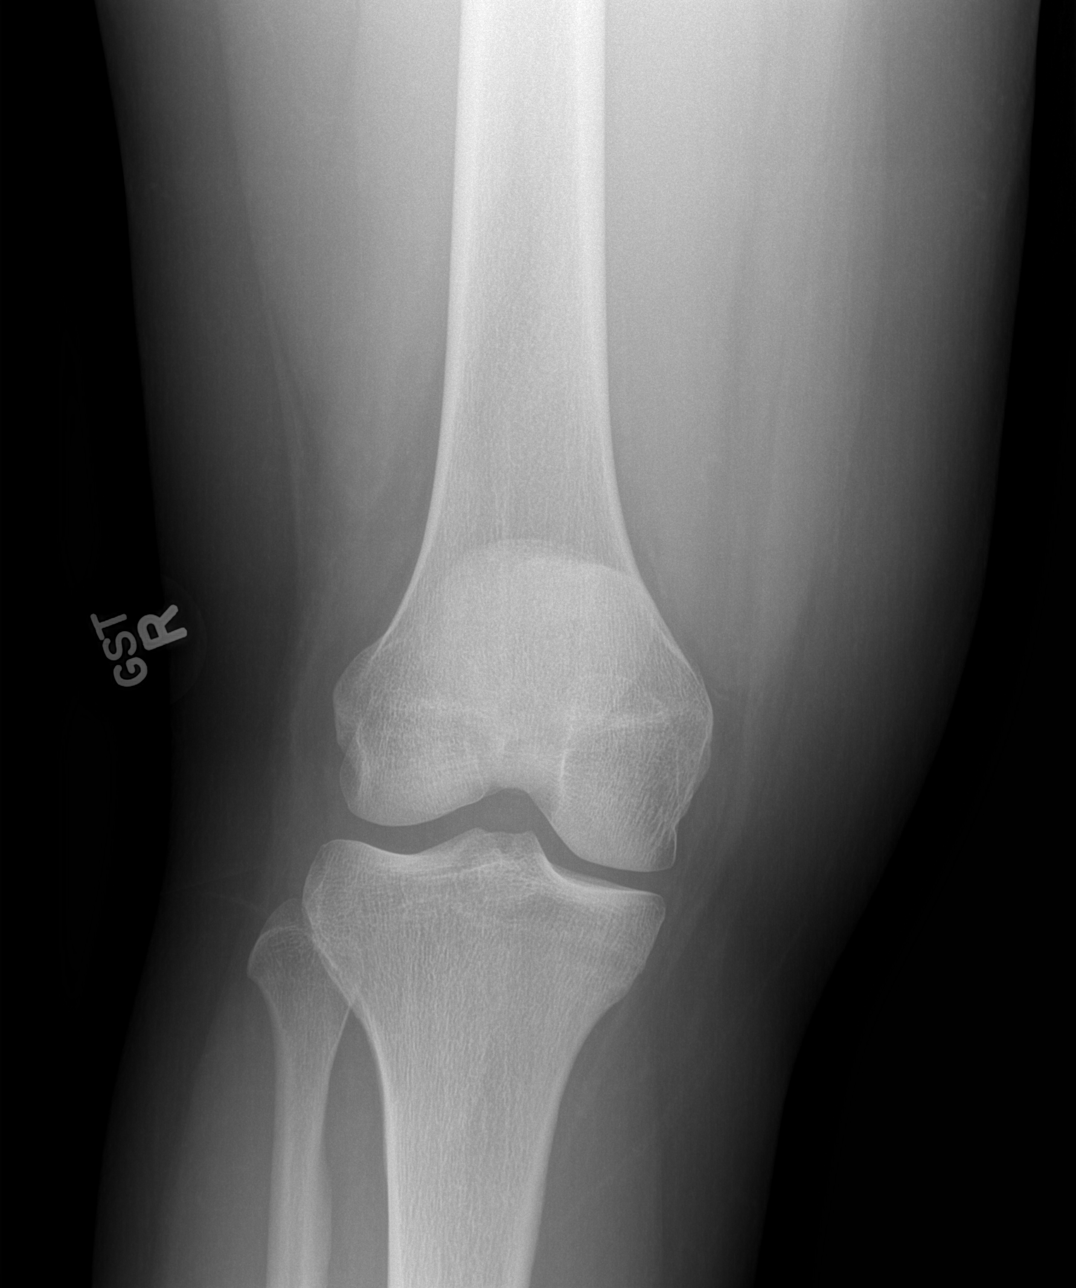

[t knee oblique right (1 of 2)]
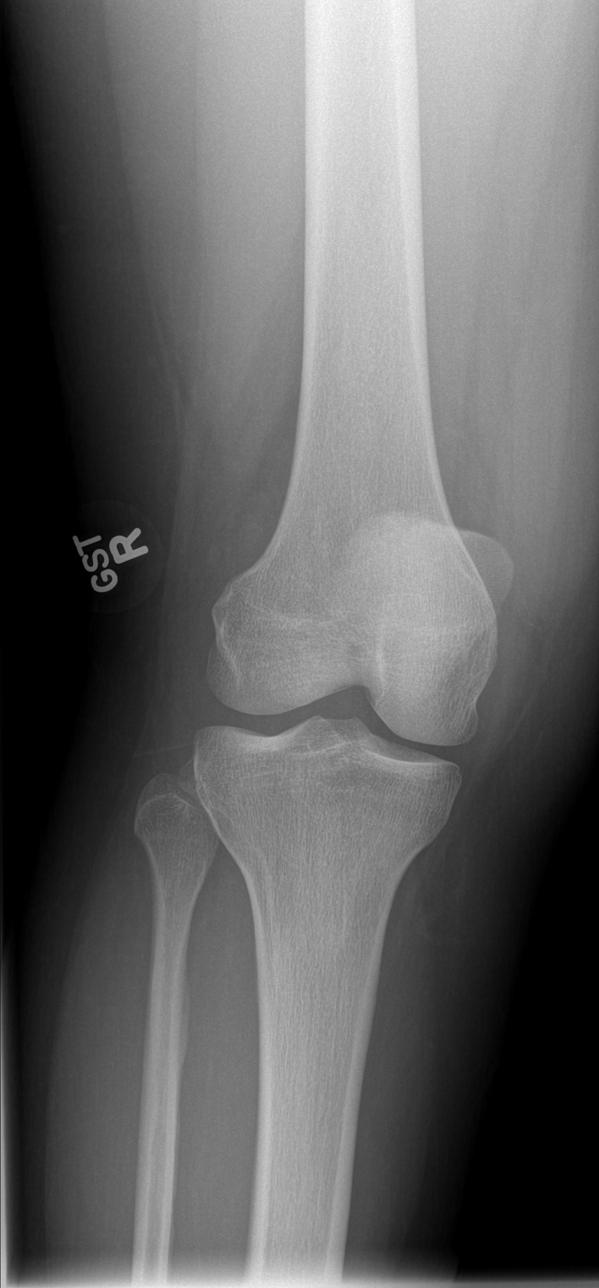

[t knee oblique right (2 of 2)]
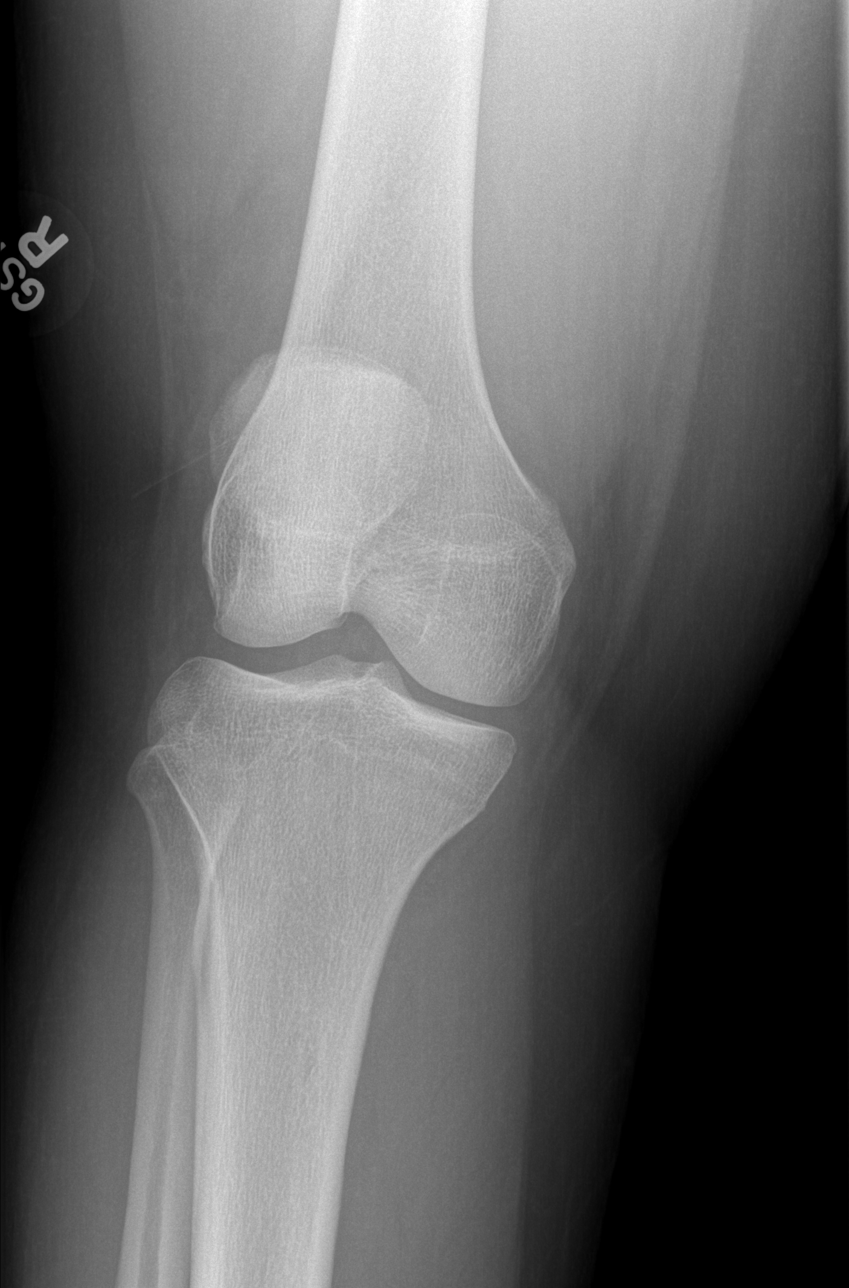

[t knee lat right]
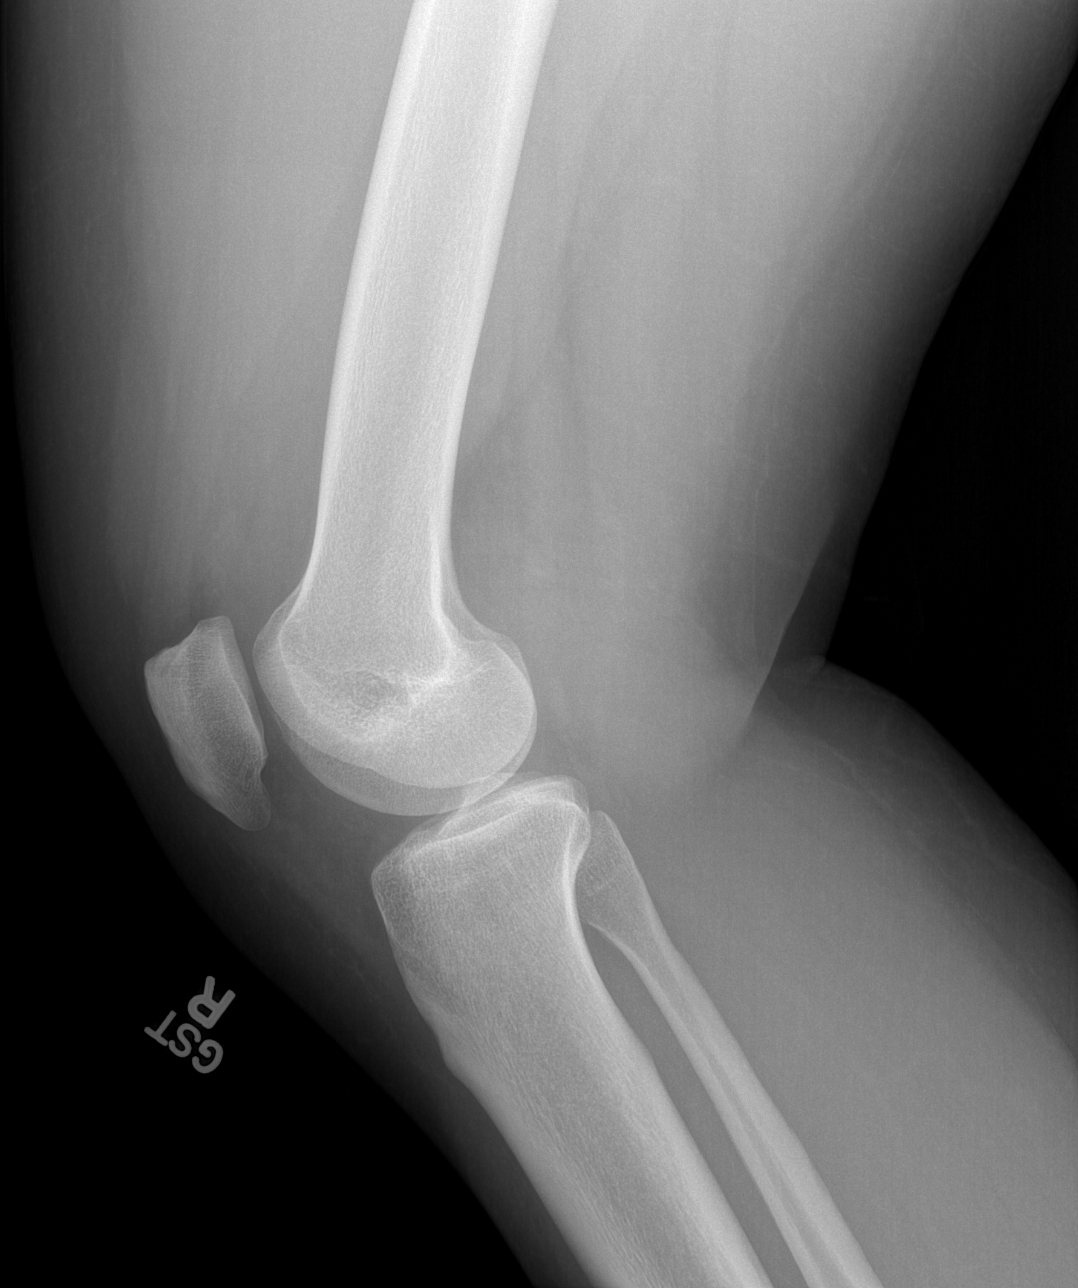

[4 of 4 positions shown; findings below may reference images not displayed]

FINDINGS: No fracture or dislocation.  No plain film evidence of
stress injury.
IMPRESSION: No fracture or dislocation.

## 2021-04-02 ENCOUNTER — Encounter (HOSPITAL_COMMUNITY): Payer: Self-pay | Admitting: *Deleted

## 2021-04-02 ENCOUNTER — Emergency Department (HOSPITAL_COMMUNITY)
Admission: EM | Admit: 2021-04-02 | Discharge: 2021-04-02 | Disposition: A | Discharge status: Home / Self Care | Payer: Self-pay | Attending: Emergency Medicine | Admitting: Emergency Medicine

## 2021-04-02 DIAGNOSIS — Y92410 Unspecified street and highway as the place of occurrence of the external cause: Secondary | ICD-10-CM | POA: Insufficient documentation

## 2021-04-02 DIAGNOSIS — M25561 Pain in right knee: Secondary | ICD-10-CM | POA: Insufficient documentation

## 2021-04-02 MED ORDER — NAPROXEN 500 MG PO TABS
500.0000 mg | ORAL_TABLET | Freq: Once | ORAL | Status: AC
  Administered 2021-04-02: 500 mg via ORAL
  Filled 2021-04-02: qty 1

## 2021-04-02 MED ORDER — NAPROXEN 500 MG PO TABS: 500.0000 mg | ORAL_TABLET | Freq: Two times a day (BID) | ORAL | 0 refills | Status: AC

## 2021-04-02 NOTE — Discharge Instructions (Addendum)
We discussed applying heat or ice to your shoulders along with need to help with your symptoms.  You were provided with a prescription of anti-inflammatories to help with your pain.  Please be aware this medication needs to be taken with food.  Follow-up with your primary care physician as needed.
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
## 2022-08-04 NOTE — L&D Delivery Note (Signed)
Delivery Note Amy Greene is a 32 y.o. G2P2002 at [redacted]w[redacted]d admitted for labor.   GBS Status:  Positive/-- (09/19 0813)  Labor course: Initial SVE: 5/70/-2. Augmentation with: Pitocin and AROM. She then progressed to complete.  ROM: 1h 84m with clear fluid  Birth: After a brief 2nd stage, she delivered a Live born female  Birth Weight: 6 lb 9.1 oz (2980 g) APGAR: 8, 9  Newborn Delivery   Birth date/time: 05/15/2025 06:26:34 Delivery type: VBAC, Spontaneous        Delivered via vaginal birth after cesarean (VBAC) (Presentation: LOA ). Nuchal cord present: No.  Shoulders and body delivered in usual fashion. Infant placed directly on mom's abdomen for bonding/skin-to-skin, baby dried and stimulated. Cord clamped x 2 after 1 minute and cut by pt's mom.  Cord blood collected. Placenta delivered-Spontaneous with 3 vessels. 20u Pitocin in 500cc LR given as a bolus prior to delivery of placenta.  Fundus firm with massage. Placenta inspected and appears to be intact with a 3 VC.  Sponge and instrument count were correct x2.  Intrapartum complications:  None Anesthesia:  epidural Lacerations:  none Suture Repair:  EBL (mL):86.00    Mom to postpartum.  Baby to Couplet care / Skin to Skin. Placenta to L&D   Plans to Breast and bottlefeed Contraception: nexplanon Circumcision: N/A  Note sent to [facility]: [facility] for pp visit.  Delivery Report:  Review the Delivery Report for details.     Signed: Jacklyn Shell, DNP,CNM 05/19/2023, 9:55 AM

## 2022-11-18 LAB — OB RESULTS CONSOLE ABO/RH: RH Type: POSITIVE

## 2022-11-18 LAB — OB RESULTS CONSOLE GC/CHLAMYDIA
Chlamydia: NEGATIVE
Neisseria Gonorrhea: NEGATIVE

## 2022-11-18 LAB — OB RESULTS CONSOLE RPR: RPR: NONREACTIVE

## 2022-11-18 LAB — HIV ANTIBODY (ROUTINE TESTING W REFLEX): HIV Screen 4th Generation wRfx: NONREACTIVE

## 2022-11-18 LAB — OB RESULTS CONSOLE VARICELLA ZOSTER ANTIBODY, IGG: Varicella: IMMUNE

## 2022-11-18 LAB — OB RESULTS CONSOLE HGB/HCT, BLOOD
HCT: 33 (ref 29–41)
Hemoglobin: 10.7

## 2022-11-18 LAB — OB RESULTS CONSOLE HEPATITIS B SURFACE ANTIGEN: Hepatitis B Surface Ag: NEGATIVE

## 2022-11-18 LAB — OB RESULTS CONSOLE ANTIBODY SCREEN: Antibody Screen: NEGATIVE

## 2022-11-18 LAB — OB RESULTS CONSOLE RUBELLA ANTIBODY, IGM: Rubella: NON-IMMUNE/NOT IMMUNE

## 2022-11-18 LAB — HEPATITIS C ANTIBODY: HCV Ab: NEGATIVE

## 2022-11-18 LAB — OB RESULTS CONSOLE PLATELET COUNT: Platelets: 296

## 2023-02-17 ENCOUNTER — Ambulatory Visit (INDEPENDENT_AMBULATORY_CARE_PROVIDER_SITE_OTHER): Payer: BLUE CROSS/BLUE SHIELD | Admitting: Family Medicine

## 2023-02-17 ENCOUNTER — Other Ambulatory Visit: Payer: Self-pay | Admitting: Family Medicine

## 2023-02-17 ENCOUNTER — Encounter: Payer: Self-pay | Admitting: Family Medicine

## 2023-02-17 VITALS — BP 100/69 | HR 87 | Wt 194.8 lb

## 2023-02-17 DIAGNOSIS — K429 Umbilical hernia without obstruction or gangrene: Secondary | ICD-10-CM | POA: Diagnosis not present

## 2023-02-17 DIAGNOSIS — K5900 Constipation, unspecified: Secondary | ICD-10-CM

## 2023-02-17 DIAGNOSIS — B3731 Acute candidiasis of vulva and vagina: Secondary | ICD-10-CM | POA: Diagnosis not present

## 2023-02-17 DIAGNOSIS — O99012 Anemia complicating pregnancy, second trimester: Secondary | ICD-10-CM | POA: Diagnosis not present

## 2023-02-17 DIAGNOSIS — Z98891 History of uterine scar from previous surgery: Secondary | ICD-10-CM | POA: Insufficient documentation

## 2023-02-17 DIAGNOSIS — O99013 Anemia complicating pregnancy, third trimester: Secondary | ICD-10-CM

## 2023-02-17 DIAGNOSIS — Z3403 Encounter for supervision of normal first pregnancy, third trimester: Secondary | ICD-10-CM | POA: Insufficient documentation

## 2023-02-17 DIAGNOSIS — Z3A27 27 weeks gestation of pregnancy: Secondary | ICD-10-CM

## 2023-02-17 DIAGNOSIS — O0990 Supervision of high risk pregnancy, unspecified, unspecified trimester: Secondary | ICD-10-CM | POA: Insufficient documentation

## 2023-02-17 DIAGNOSIS — O99892 Other specified diseases and conditions complicating childbirth: Secondary | ICD-10-CM

## 2023-02-17 HISTORY — DX: Encounter for supervision of normal first pregnancy, third trimester: Z34.03

## 2023-02-17 MED ORDER — FLUCONAZOLE 150 MG PO TABS: 150.0000 mg | ORAL_TABLET | Freq: Once | ORAL | 0 refills | Status: DC

## 2023-02-17 MED ORDER — POLYETHYLENE GLYCOL 3350 17 GM/SCOOP PO POWD: 17.0000 g | Freq: Every day | ORAL | 5 refills | Status: DC | PRN

## 2023-02-17 NOTE — Patient Instructions (Addendum)
Third Trimester of Pregnancy  The third trimester of pregnancy is from week 28 through week 40. This is months 7 through 9. The third trimester is a time when the unborn baby (fetus) is growing rapidly. At the end of the ninth month, the fetus is about 20 inches long and weighs 6-10 pounds. Body changes during your third trimester During the third trimester, your body will continue to go through many changes. The changes vary and generally return to normal after your baby is born. Physical changes Your weight will continue to increase. You can expect to gain 25-35 pounds (11-16 kg) by the end of the pregnancy if you begin pregnancy at a normal weight. If you are underweight, you can expect to gain 28-40 lb (about 13-18 kg), and if you are overweight, you can expect to gain 15-25 lb (about 7-11 kg). You may begin to get stretch marks on your hips, abdomen, and breasts. Your breasts will continue to grow and may hurt. A yellow fluid (colostrum) may leak from your breasts. This is the first milk you are producing for your baby. You may have changes in your hair. These can include thickening of your hair, rapid growth, and changes in texture. Some people also have hair loss during or after pregnancy, or hair that feels dry or thin. Your belly button may stick out. You may notice more swelling in your hands, face, or ankles. Health changes You may have heartburn. You may have constipation. You may develop hemorrhoids. You may develop swollen, bulging veins (varicose veins) in your legs. You may have increased body aches in the pelvis, back, or thighs. This is due to weight gain and increased hormones that are relaxing your joints. You may have increased tingling or numbness in your hands, arms, and legs. The skin on your abdomen may also feel numb. You may feel short of breath because of your expanding uterus. Other changes You may urinate more often because the fetus is moving lower into your pelvis  and pressing on your bladder. You may have more problems sleeping. This may be caused by the size of your abdomen, an increased need to urinate, and an increase in your body's metabolism. You may notice the fetus "dropping," or moving lower in your abdomen (lightening). You may have increased vaginal discharge. You may notice that you have pain around your pelvic bone as your uterus distends. Follow these instructions at home: Medicines Follow your health care provider's instructions regarding medicine use. Specific medicines may be either safe or unsafe to take during pregnancy. Do not take any medicines unless approved by your health care provider. Take a prenatal vitamin that contains at least 600 micrograms (mcg) of folic acid. Eating and drinking Eat a healthy diet that includes fresh fruits and vegetables, whole grains, good sources of protein such as meat, eggs, or tofu, and low-fat dairy products. Avoid raw meat and unpasteurized juice, milk, and cheese. These carry germs that can harm you and your baby. Eat 4 or 5 small meals rather than 3 large meals a day. You may need to take these actions to prevent or treat constipation: Drink enough fluid to keep your urine pale yellow. Eat foods that are high in fiber, such as beans, whole grains, and fresh fruits and vegetables. Limit foods that are high in fat and processed sugars, such as fried or sweet foods. Activity Exercise only as directed by your health care provider. Most people can continue their usual exercise routine during pregnancy. Try  to exercise for 30 minutes at least 5 days a week. Stop exercising if you experience contractions in the uterus. Stop exercising if you develop pain or cramping in the lower abdomen or lower back. Avoid heavy lifting. Do not exercise if it is very hot or humid or if you are at a high altitude. If you choose to, you may continue to have sex unless your health care provider tells you not  to. Relieving pain and discomfort Take frequent breaks and rest with your legs raised (elevated) if you have leg cramps or low back pain. Take warm sitz baths to soothe any pain or discomfort caused by hemorrhoids. Use hemorrhoid cream if your health care provider approves. Wear a supportive bra to prevent discomfort from breast tenderness. If you develop varicose veins: Wear support hose as told by your health care provider. Elevate your feet for 15 minutes, 3-4 times a day. Limit salt in your diet. Safety Talk to your health care provider before traveling far distances. Do not use hot tubs, steam rooms, or saunas. Wear your seat belt at all times when driving or riding in a car. Talk with your health care provider if someone is verbally or physically abusive to you. Preparing for birth To prepare for the arrival of your baby: Take prenatal classes to understand, practice, and ask questions about labor and delivery. Visit the hospital and tour the maternity area. Purchase a rear-facing car seat and make sure you know how to install it in your car. Prepare the baby's room or sleeping area. Make sure to remove all pillows and stuffed animals from the baby's crib to prevent suffocation. General instructions Avoid cat litter boxes and soil used by cats. These carry germs that can cause birth defects in the baby. If you have a cat, ask someone to clean the litter box for you. Do not douche or use tampons. Do not use scented sanitary pads. Do not use any products that contain nicotine or tobacco, such as cigarettes, e-cigarettes, and chewing tobacco. If you need help quitting, ask your health care provider. Do not use any herbal remedies, illegal drugs, or medicines that were not prescribed to you. Chemicals in these products can harm your baby. Do not drink alcohol. You will have more frequent prenatal exams during the third trimester. During a routine prenatal visit, your health care provider  will do a physical exam, perform tests, and discuss your overall health. Keep all follow-up visits. This is important. Where to find more information American Pregnancy Association: americanpregnancy.org [employer]: https://www.todd-brady.net/ Office on [employer] Health: MightyReward.co.nz Contact a health care provider if you have: A fever. Mild pelvic cramps, pelvic pressure, or nagging pain in your abdominal area or lower back. Vomiting or diarrhea. Bad-smelling vaginal discharge or foul-smelling urine. Pain when you urinate. A headache that does not go away when you take medicine. Visual changes or see spots in front of your eyes. Get help right away if: Your water breaks. You have regular contractions less than 5 minutes apart. You have spotting or bleeding from your vagina. You have severe abdominal pain. You have difficulty breathing. You have chest pain. You have fainting spells. You have not felt your baby move for the time period told by your health care provider. You have new or increased pain, swelling, or redness in an arm or leg. Summary The third trimester of pregnancy is from week 28 through week 40 (months 7 through 9). You may have more problems  sleeping. This can be caused by the size of your abdomen, an increased need to urinate, and an increase in your body's metabolism. You will have more frequent prenatal exams during the third trimester. Keep all follow-up visits. This is important. This information is not intended to replace advice given to you by your health care provider. Make sure you discuss any questions you have with your health care provider. Document Revised: 12/28/2019 Document Reviewed: 11/03/2019 Elsevier Patient Education  2024 [employer].  Contraception Choices Contraception, also called birth control, refers to methods or devices that prevent pregnancy. Hormonal methods  Contraceptive  implant A contraceptive implant is a thin, plastic tube that contains a hormone that prevents pregnancy. It is different from an intrauterine device (IUD). It is inserted into the upper part of the arm by a health care provider. Implants can be effective for up to 3 years. Progestin-only injections Progestin-only injections are injections of progestin, a synthetic form of the hormone progesterone. They are given every 3 months by a health care provider. Birth control pills Birth control pills are pills that contain hormones that prevent pregnancy. They must be taken once a day, preferably at the same time each day. A prescription is needed to use this method of contraception. Birth control patch The birth control patch contains hormones that prevent pregnancy. It is placed on the skin and must be changed once a week for three weeks and removed on the fourth week. A prescription is needed to use this method of contraception. Vaginal ring A vaginal ring contains hormones that prevent pregnancy. It is placed in the vagina for three weeks and removed on the fourth week. After that, the process is repeated with a new ring. A prescription is needed to use this method of contraception. Emergency contraceptive Emergency contraceptives prevent pregnancy after unprotected sex. They come in pill form and can be taken up to 5 days after sex. They work best the sooner they are taken after having sex. Most emergency contraceptives are available without a prescription. This method should not be used as your only form of birth control. Barrier methods  Female condom A female condom is a thin sheath that is worn over the penis during sex. Condoms keep sperm from going inside a woman's body. They can be used with a sperm-killing substance (spermicide) to increase their effectiveness. They should be thrown away after one use. Female condom A female condom is a soft, loose-fitting sheath that is put into the vagina before  sex. The condom keeps sperm from going inside a woman's body. They should be thrown away after one use. Diaphragm A diaphragm is a soft, dome-shaped barrier. It is inserted into the vagina before sex, along with a spermicide. The diaphragm blocks sperm from entering the uterus, and the spermicide kills sperm. A diaphragm should be left in the vagina for 6-8 hours after sex and removed within 24 hours. A diaphragm is prescribed and fitted by a health care provider. A diaphragm should be replaced every 1-2 years, after giving birth, after gaining more than 15 lb (6.8 kg), and after pelvic surgery. Cervical cap A cervical cap is a round, soft latex or plastic cup that fits over the cervix. It is inserted into the vagina before sex, along with spermicide. It blocks sperm from entering the uterus. The cap should be left in place for 6-8 hours after sex and removed within 48 hours. A cervical cap must be prescribed and fitted by a health care provider. It  should be replaced every 2 years. Sponge A sponge is a soft, circular piece of polyurethane foam with spermicide in it. The sponge helps block sperm from entering the uterus, and the spermicide kills sperm. To use it, you make it wet and then insert it into the vagina. It should be inserted before sex, left in for at least 6 hours after sex, and removed and thrown away within 30 hours. Spermicides Spermicides are chemicals that kill or block sperm from entering the cervix and uterus. They can come as a cream, jelly, suppository, foam, or tablet. A spermicide should be inserted into the vagina with an applicator at least 10-15 minutes before sex to allow time for it to work. The process must be repeated every time you have sex. Spermicides do not require a prescription. Intrauterine contraception Intrauterine device (IUD) An IUD is a T-shaped device that is put in a woman's uterus. There are two types: Hormone IUD.This type contains progestin, a synthetic  form of the hormone progesterone. This type can stay in place for 3-5 years. Copper IUD.This type is wrapped in copper wire. It can stay in place for 10 years. Permanent methods of contraception Female tubal ligation In this method, a woman's fallopian tubes are sealed, tied, or blocked during surgery to prevent eggs from traveling to the uterus. Hysteroscopic sterilization In this method, a small, flexible insert is placed into each fallopian tube. The inserts cause scar tissue to form in the fallopian tubes and block them, so sperm cannot reach an egg. The procedure takes about 3 months to be effective. Another form of birth control must be used during those 3 months. Female sterilization This is a procedure to tie off the tubes that carry sperm (vasectomy). After the procedure, the man can still ejaculate fluid (semen). Another form of birth control must be used for 3 months after the procedure. Natural planning methods Natural family planning In this method, a couple does not have sex on days when the woman could become pregnant. Calendar method In this method, the woman keeps track of the length of each menstrual cycle, identifies the days when pregnancy can happen, and does not have sex on those days. Ovulation method In this method, a couple avoids sex during ovulation. Symptothermal method This method involves not having sex during ovulation. The woman typically checks for ovulation by watching changes in her temperature and in the consistency of cervical mucus. Post-ovulation method In this method, a couple waits to have sex until after ovulation. Where to find more information Centers for Disease Control and Prevention: FootballExhibition.com.br Summary Contraception, also called birth control, refers to methods or devices that prevent pregnancy. Hormonal methods of contraception include implants, injections, pills, patches, vaginal rings, and emergency contraceptives. Barrier methods of  contraception can include female condoms, female condoms, diaphragms, cervical caps, sponges, and spermicides. There are two types of IUDs (intrauterine devices). An IUD can be put in a woman's uterus to prevent pregnancy for 3-5 years. Permanent sterilization can be done through a procedure for males and females. Natural family planning methods involve nothaving sex on days when the woman could become pregnant. This information is not intended to replace advice given to you by your health care provider. Make sure you discuss any questions you have with your health care provider.   Safe Medications in Pregnancy   Acne:  Benzoyl Peroxide  Salicylic Acid   Backache/Headache:  Tylenol: 2 regular strength every 4 hours OR  2 Extra strength every 6 hours   Colds/Coughs/Allergies:  Benadryl (alcohol free) 25 mg every 6 hours as needed  Breath right strips  Claritin  Cepacol throat lozenges  Chloraseptic throat spray  Cold-Eeze- up to three times per day  Cough drops, alcohol free  Flonase (by prescription only)  Guaifenesin  Mucinex  Robitussin DM (plain only, alcohol free)  Saline nasal spray/drops  Sudafed (pseudoephedrine) & Actifed * use only after [redacted] weeks gestation and if you do not have high blood pressure  Tylenol  Vicks Vaporub  Zinc lozenges  Zyrtec   Constipation:  Colace  Ducolax suppositories  Fleet enema  Glycerin suppositories  Metamucil  Milk of magnesia  Miralax  Senokot  Smooth move tea   Diarrhea:  Kaopectate  Imodium A-D   *NO pepto Bismol   Hemorrhoids:  Anusol  Anusol HC  Preparation H  Tucks   Indigestion:  Tums  Maalox  Mylanta  Zantac  Pepcid   Insomnia:  Benadryl (alcohol free) 25mg  every 6 hours as needed  Tylenol PM  Unisom, no Gelcaps   Leg Cramps:  Tums  MagGel   Nausea/Vomiting:  Bonine  Dramamine  Emetrol  Ginger extract  Sea bands  Meclizine  Nausea medication to take during pregnancy:  Unisom  (doxylamine succinate 25 mg tablets) Take one tablet daily at bedtime. If symptoms are not adequately controlled, the dose can be increased to a maximum recommended dose of two tablets daily (1/2 tablet in the morning, 1/2 tablet mid-afternoon and one at bedtime).  Vitamin B6 100mg  tablets. Take one tablet twice a day (up to 200 mg per day).   Skin Rashes:  Aveeno products  Benadryl cream or 25mg  every 6 hours as needed  Calamine Lotion  1% cortisone cream   Yeast infection:  Gyne-lotrimin 7  Monistat 7    **If taking multiple medications, please check labels to avoid duplicating the same active ingredients  **take medication as directed on the label  ** Do not exceed 4000 mg of tylenol in 24 hours  **Do not take medications that contain aspirin or ibuprofen           Document Revised: 12/26/2019 Document Reviewed: 12/26/2019 Elsevier Patient Education  2024 [employer].

## 2023-02-17 NOTE — Progress Notes (Unsigned)
Subjective:   Amy Greene is a 32 y.o. G2P1001 at [redacted]w[redacted]d by midtrimester ultrasound being seen today for her first obstetrical visit.  Her obstetrical history is significant for  one prior cesarean . Patient does intend to breast feed. Pregnancy history fully reviewed.  Moving to [place] from [place].  Patient reports an umbilical hernia, sometimes gets hard and won't go down.  First pregnancy had a cesarean for baby's heart rate dropping. Very much desires a TOLAC.   HISTORY: OB History  Gravida Para Term Preterm AB Living  2 1 1  0 0 1  SAB IAB Ectopic Multiple Live Births  0 0 0 0 1    # Outcome Date GA Lbr Len/2nd Weight Sex Type Anes PTL Lv  2 Current           1 Term 09/17/17 [redacted]w[redacted]d  8 lb 5 oz (3.771 kg) M CS-LTranv  N LIV     Complications: Fetal Intolerance     Last pap smear: No results found for: "DIAGPAP", "HPV", "HPVHIGH" *pap pp*  Past Medical History:  Diagnosis Date   ECZEMA 09/24/2009   Medical history non-contributory    Past Surgical History:  Procedure Laterality Date   CESAREAN SECTION     History reviewed. No pertinent family history. Social History   Tobacco Use   Smoking status: Never  Vaping Use   Vaping status: Never Used  Substance Use Topics   Alcohol use: No    Comment: social   Drug use: Not Currently   No Known Allergies Current Outpatient Medications on File Prior to Visit  Medication Sig Dispense Refill   Prenatal Vit-Fe Fumarate-FA (MULTIVITAMIN-PRENATAL) 27-0.8 MG TABS tablet Take 1 tablet by mouth daily at 12 noon.     No current facility-administered medications on file prior to visit.     Exam   Vitals:   02/17/23 1602  BP: 100/69  Pulse: 87  Weight: 194 lb 12.8 oz (88.4 kg)   Fetal Heart Rate (bpm): 154  System: General: well-developed, well-nourished female in no acute distress   Skin: normal coloration and turgor, no rashes   Neurologic: oriented, normal, negative, normal mood   Extremities: normal  strength, tone, and muscle mass, ROM of all joints is normal   HEENT PERRLA, extraocular movement intact and sclera clear, anicteric   Neck supple and no masses   Respiratory:  no respiratory distress      Assessment:   Pregnancy: G2P1001 Patient Active Problem List   Diagnosis Date Noted   Delivery by emergency cesarean section 02/17/2023   Encounter for supervision of normal first pregnancy in third trimester 02/17/2023   BMI 31.0-31.9,adult 08/30/2011   Right knee injury 08/30/2011   Contraception management 09/22/2010   Physical exam, annual 09/18/2010     Plan:  1. Encounter for supervision of normal first pregnancy in third trimester BP and FHR normal Thinks she has a yeast infection, diflucan sent Accepts tdap today Labs from [place] reviewed in Care Everywhere, all normal Discussed fasting labs for next visit Ultrasound discussed; fetal anatomic survey: ordered. Problem list reviewed and updated. The nature of [facility] with multiple MDs and other Advanced Practice Providers was explained to patient; also emphasized that residents, students are part of our team.  2. Delivery by emergency cesarean section Was having unmedicated delivery and then got epidural, BP dropped and she had to have an emergency c section Not sure how dilated she was at the time  but thinks she was far along in labor Reports she delivered at 39 weeks exactly, went to hospital initially because of SROM Desires TOLAC, discuss at next visit  3. Umbilical hernia No signs/symptoms of strangulation Does feel incarcerated though with likely just some fat Discussed ED precautions in detail Will refer to Gen Surg after delivery  Routine obstetric precautions reviewed. Return in 2 weeks (on 03/03/2023) for [facility], ob visit.

## 2023-02-18 ENCOUNTER — Encounter: Payer: Self-pay | Admitting: Family Medicine

## 2023-02-18 ENCOUNTER — Encounter: Payer: Self-pay | Admitting: *Deleted

## 2023-02-18 DIAGNOSIS — Z23 Encounter for immunization: Secondary | ICD-10-CM

## 2023-02-18 DIAGNOSIS — O99013 Anemia complicating pregnancy, third trimester: Secondary | ICD-10-CM | POA: Insufficient documentation

## 2023-02-18 DIAGNOSIS — O99210 Obesity complicating pregnancy, unspecified trimester: Secondary | ICD-10-CM | POA: Insufficient documentation

## 2023-02-18 LAB — CBC
Hematocrit: 27.7 % — ABNORMAL LOW (ref 34.0–46.6)
Hemoglobin: 9.3 g/dL — ABNORMAL LOW (ref 11.1–15.9)
MCH: 30.2 pg (ref 26.6–33.0)
MCHC: 33.6 g/dL (ref 31.5–35.7)
MCV: 90 fL (ref 79–97)
Platelets: 224 10*3/uL (ref 150–450)
RBC: 3.08 x10E6/uL — ABNORMAL LOW (ref 3.77–5.28)
RDW: 12.1 % (ref 11.7–15.4)
WBC: 8.2 10*3/uL (ref 3.4–10.8)

## 2023-02-18 LAB — RPR: RPR Ser Ql: NONREACTIVE

## 2023-02-18 LAB — HIV ANTIBODY (ROUTINE TESTING W REFLEX): HIV Screen 4th Generation wRfx: NONREACTIVE

## 2023-02-18 MED ORDER — FERROUS SULFATE 325 (65 FE) MG PO TBEC: 325.0000 mg | DELAYED_RELEASE_TABLET | ORAL | 2 refills | Status: DC

## 2023-02-19 ENCOUNTER — Telehealth: Payer: Self-pay | Admitting: Family Medicine

## 2023-02-19 ENCOUNTER — Ambulatory Visit: Payer: Medicaid Other

## 2023-02-19 DIAGNOSIS — O99210 Obesity complicating pregnancy, unspecified trimester: Secondary | ICD-10-CM

## 2023-02-19 MED ORDER — FERROUS SULFATE 325 (65 FE) MG PO TBEC: 325.0000 mg | DELAYED_RELEASE_TABLET | ORAL | 2 refills | Status: DC

## 2023-02-19 MED ORDER — POLYETHYLENE GLYCOL 3350 17 GM/SCOOP PO POWD: 17.0000 g | Freq: Every day | ORAL | 5 refills | Status: DC | PRN

## 2023-02-19 MED ORDER — FLUCONAZOLE 150 MG PO TABS: 150.0000 mg | ORAL_TABLET | Freq: Once | ORAL | 1 refills | Status: AC

## 2023-02-19 NOTE — Telephone Encounter (Signed)
Left message that I returning call if she continues to have questions or concerns to please give the office a call back.   Leonette Nutting  02/19/23

## 2023-02-19 NOTE — Addendum Note (Signed)
Addended by: Merian Capron on: 02/19/2023 04:40 PM   Modules accepted: Orders

## 2023-02-19 NOTE — Telephone Encounter (Signed)
Patient called in stating she is unable to get her medication that was prescribed to her. Pharmacy is saying that an order needs to be signed by the provider.

## 2023-03-04 ENCOUNTER — Other Ambulatory Visit: Payer: Self-pay | Admitting: *Deleted

## 2023-03-04 ENCOUNTER — Other Ambulatory Visit: Payer: Self-pay | Admitting: Family Medicine

## 2023-03-04 ENCOUNTER — Ambulatory Visit: Payer: BLUE CROSS/BLUE SHIELD | Attending: Family Medicine | Admitting: *Deleted

## 2023-03-04 ENCOUNTER — Encounter: Payer: Self-pay | Admitting: *Deleted

## 2023-03-04 ENCOUNTER — Ambulatory Visit (HOSPITAL_BASED_OUTPATIENT_CLINIC_OR_DEPARTMENT_OTHER): Payer: BLUE CROSS/BLUE SHIELD

## 2023-03-04 VITALS — BP 108/55 | HR 89

## 2023-03-04 DIAGNOSIS — O99013 Anemia complicating pregnancy, third trimester: Secondary | ICD-10-CM

## 2023-03-04 DIAGNOSIS — O365930 Maternal care for other known or suspected poor fetal growth, third trimester, not applicable or unspecified: Secondary | ICD-10-CM | POA: Insufficient documentation

## 2023-03-04 DIAGNOSIS — O34219 Maternal care for unspecified type scar from previous cesarean delivery: Secondary | ICD-10-CM

## 2023-03-04 DIAGNOSIS — B3731 Acute candidiasis of vulva and vagina: Secondary | ICD-10-CM

## 2023-03-04 DIAGNOSIS — Z3403 Encounter for supervision of normal first pregnancy, third trimester: Secondary | ICD-10-CM

## 2023-03-04 DIAGNOSIS — O99892 Other specified diseases and conditions complicating childbirth: Secondary | ICD-10-CM

## 2023-03-04 DIAGNOSIS — O99212 Obesity complicating pregnancy, second trimester: Secondary | ICD-10-CM | POA: Diagnosis not present

## 2023-03-04 DIAGNOSIS — Z363 Encounter for antenatal screening for malformations: Secondary | ICD-10-CM | POA: Insufficient documentation

## 2023-03-04 DIAGNOSIS — O99213 Obesity complicating pregnancy, third trimester: Secondary | ICD-10-CM | POA: Insufficient documentation

## 2023-03-04 DIAGNOSIS — Z3A29 29 weeks gestation of pregnancy: Secondary | ICD-10-CM | POA: Diagnosis not present

## 2023-03-04 DIAGNOSIS — K5900 Constipation, unspecified: Secondary | ICD-10-CM

## 2023-03-04 DIAGNOSIS — K429 Umbilical hernia without obstruction or gangrene: Secondary | ICD-10-CM

## 2023-03-05 ENCOUNTER — Encounter: Payer: Self-pay | Admitting: Family Medicine

## 2023-03-05 DIAGNOSIS — O365990 Maternal care for other known or suspected poor fetal growth, unspecified trimester, not applicable or unspecified: Secondary | ICD-10-CM | POA: Insufficient documentation

## 2023-03-09 ENCOUNTER — Other Ambulatory Visit: Payer: Medicaid Other

## 2023-03-09 ENCOUNTER — Ambulatory Visit: Payer: Medicaid Other

## 2023-03-10 ENCOUNTER — Encounter: Payer: Medicaid Other | Admitting: Obstetrics & Gynecology

## 2023-03-10 ENCOUNTER — Other Ambulatory Visit: Payer: Medicaid Other

## 2023-03-12 ENCOUNTER — Ambulatory Visit (HOSPITAL_BASED_OUTPATIENT_CLINIC_OR_DEPARTMENT_OTHER): Payer: BLUE CROSS/BLUE SHIELD | Admitting: *Deleted

## 2023-03-12 ENCOUNTER — Ambulatory Visit: Payer: BLUE CROSS/BLUE SHIELD | Attending: Maternal & Fetal Medicine

## 2023-03-12 ENCOUNTER — Ambulatory Visit: Payer: BLUE CROSS/BLUE SHIELD | Admitting: *Deleted

## 2023-03-12 VITALS — BP 103/58 | HR 88

## 2023-03-12 DIAGNOSIS — O99213 Obesity complicating pregnancy, third trimester: Secondary | ICD-10-CM

## 2023-03-12 DIAGNOSIS — Z3A30 30 weeks gestation of pregnancy: Secondary | ICD-10-CM

## 2023-03-12 DIAGNOSIS — O99013 Anemia complicating pregnancy, third trimester: Secondary | ICD-10-CM

## 2023-03-12 DIAGNOSIS — O365930 Maternal care for other known or suspected poor fetal growth, third trimester, not applicable or unspecified: Secondary | ICD-10-CM

## 2023-03-12 DIAGNOSIS — E669 Obesity, unspecified: Secondary | ICD-10-CM

## 2023-03-12 DIAGNOSIS — D649 Anemia, unspecified: Secondary | ICD-10-CM

## 2023-03-12 NOTE — Procedures (Signed)
Amy Greene 09-24-1990 [redacted]w[redacted]d  Fetus A Non-Stress Test Interpretation for 03/12/23 NST only  Indication: IUGR  Fetal Heart Rate A Mode: External Baseline Rate (A): 135 bpm Variability: Moderate Accelerations: 15 x 15 Decelerations: None Multiple birth?: No  Uterine Activity Mode: Palpation, Toco Contraction Frequency (min): none Resting Tone Palpated: Relaxed  Interpretation (Fetal Testing) Nonstress Test Interpretation: Reactive Overall Impression: Reassuring for gestational age Comments: Dr. Judeth Cornfield reviewed tracing

## 2023-03-19 ENCOUNTER — Ambulatory Visit: Payer: BLUE CROSS/BLUE SHIELD | Attending: Maternal & Fetal Medicine

## 2023-03-19 ENCOUNTER — Ambulatory Visit: Payer: BLUE CROSS/BLUE SHIELD | Admitting: *Deleted

## 2023-03-19 ENCOUNTER — Ambulatory Visit (HOSPITAL_BASED_OUTPATIENT_CLINIC_OR_DEPARTMENT_OTHER): Payer: BLUE CROSS/BLUE SHIELD | Admitting: *Deleted

## 2023-03-19 VITALS — BP 100/60 | HR 87

## 2023-03-19 DIAGNOSIS — O365930 Maternal care for other known or suspected poor fetal growth, third trimester, not applicable or unspecified: Secondary | ICD-10-CM | POA: Insufficient documentation

## 2023-03-19 DIAGNOSIS — O99013 Anemia complicating pregnancy, third trimester: Secondary | ICD-10-CM | POA: Diagnosis not present

## 2023-03-19 DIAGNOSIS — O99213 Obesity complicating pregnancy, third trimester: Secondary | ICD-10-CM

## 2023-03-19 DIAGNOSIS — E669 Obesity, unspecified: Secondary | ICD-10-CM

## 2023-03-19 DIAGNOSIS — Z3A31 31 weeks gestation of pregnancy: Secondary | ICD-10-CM

## 2023-03-19 DIAGNOSIS — D649 Anemia, unspecified: Secondary | ICD-10-CM

## 2023-03-19 NOTE — Procedures (Signed)
Amy Greene 01/19/1991 [redacted]w[redacted]d  Fetus A Non-Stress Test Interpretation for 03/19/23  NST only  Indication: IUGR  Fetal Heart Rate A Mode: External Baseline Rate (A): 135 bpm Variability: Moderate Accelerations: 15 x 15 Decelerations: None Multiple birth?: No  Uterine Activity Mode: Palpation, Toco Contraction Frequency (min): none Resting Tone Palpated: Relaxed  Interpretation (Fetal Testing) Nonstress Test Interpretation: Reactive Overall Impression: Reassuring for gestational age Comments: Dr. Judeth Cornfield reviewed tracing

## 2023-03-26 ENCOUNTER — Ambulatory Visit: Payer: No Typology Code available for payment source | Attending: Maternal & Fetal Medicine

## 2023-03-26 ENCOUNTER — Ambulatory Visit: Payer: BLUE CROSS/BLUE SHIELD | Admitting: *Deleted

## 2023-03-26 VITALS — BP 102/55 | HR 91

## 2023-03-26 DIAGNOSIS — O99213 Obesity complicating pregnancy, third trimester: Secondary | ICD-10-CM | POA: Diagnosis not present

## 2023-03-26 DIAGNOSIS — O365930 Maternal care for other known or suspected poor fetal growth, third trimester, not applicable or unspecified: Secondary | ICD-10-CM

## 2023-03-26 DIAGNOSIS — E669 Obesity, unspecified: Secondary | ICD-10-CM

## 2023-03-26 DIAGNOSIS — O99013 Anemia complicating pregnancy, third trimester: Secondary | ICD-10-CM | POA: Diagnosis not present

## 2023-03-26 DIAGNOSIS — Z3A32 32 weeks gestation of pregnancy: Secondary | ICD-10-CM

## 2023-03-26 DIAGNOSIS — D649 Anemia, unspecified: Secondary | ICD-10-CM

## 2023-03-27 ENCOUNTER — Other Ambulatory Visit: Payer: Self-pay | Admitting: *Deleted

## 2023-03-27 DIAGNOSIS — O99213 Obesity complicating pregnancy, third trimester: Secondary | ICD-10-CM

## 2023-03-27 DIAGNOSIS — O365930 Maternal care for other known or suspected poor fetal growth, third trimester, not applicable or unspecified: Secondary | ICD-10-CM

## 2023-04-01 ENCOUNTER — Other Ambulatory Visit (HOSPITAL_COMMUNITY)
Admission: RE | Admit: 2023-04-01 | Discharge: 2023-04-01 | Disposition: A | Discharge status: Home / Self Care | Payer: BLUE CROSS/BLUE SHIELD | Source: Ambulatory Visit | Attending: Obstetrics & Gynecology | Admitting: Obstetrics & Gynecology

## 2023-04-01 ENCOUNTER — Encounter: Payer: Self-pay | Admitting: *Deleted

## 2023-04-01 ENCOUNTER — Ambulatory Visit: Payer: BLUE CROSS/BLUE SHIELD | Admitting: *Deleted

## 2023-04-01 ENCOUNTER — Other Ambulatory Visit: Payer: Self-pay

## 2023-04-01 ENCOUNTER — Ambulatory Visit (HOSPITAL_BASED_OUTPATIENT_CLINIC_OR_DEPARTMENT_OTHER): Payer: BLUE CROSS/BLUE SHIELD

## 2023-04-01 ENCOUNTER — Encounter: Payer: Self-pay | Admitting: Family Medicine

## 2023-04-01 ENCOUNTER — Ambulatory Visit (INDEPENDENT_AMBULATORY_CARE_PROVIDER_SITE_OTHER): Payer: BLUE CROSS/BLUE SHIELD | Admitting: Obstetrics and Gynecology

## 2023-04-01 ENCOUNTER — Other Ambulatory Visit: Payer: Medicaid Other

## 2023-04-01 ENCOUNTER — Encounter: Payer: Self-pay | Admitting: Obstetrics and Gynecology

## 2023-04-01 VITALS — BP 99/56 | HR 80

## 2023-04-01 VITALS — BP 102/68 | HR 87 | Wt 196.0 lb

## 2023-04-01 DIAGNOSIS — O365930 Maternal care for other known or suspected poor fetal growth, third trimester, not applicable or unspecified: Secondary | ICD-10-CM

## 2023-04-01 DIAGNOSIS — D649 Anemia, unspecified: Secondary | ICD-10-CM

## 2023-04-01 DIAGNOSIS — Z3403 Encounter for supervision of normal first pregnancy, third trimester: Secondary | ICD-10-CM

## 2023-04-01 DIAGNOSIS — E669 Obesity, unspecified: Secondary | ICD-10-CM | POA: Diagnosis not present

## 2023-04-01 DIAGNOSIS — O99213 Obesity complicating pregnancy, third trimester: Secondary | ICD-10-CM

## 2023-04-01 DIAGNOSIS — N898 Other specified noninflammatory disorders of vagina: Secondary | ICD-10-CM | POA: Diagnosis not present

## 2023-04-01 DIAGNOSIS — O99013 Anemia complicating pregnancy, third trimester: Secondary | ICD-10-CM

## 2023-04-01 DIAGNOSIS — O99892 Other specified diseases and conditions complicating childbirth: Secondary | ICD-10-CM

## 2023-04-01 DIAGNOSIS — O99210 Obesity complicating pregnancy, unspecified trimester: Secondary | ICD-10-CM

## 2023-04-01 DIAGNOSIS — Z3A33 33 weeks gestation of pregnancy: Secondary | ICD-10-CM

## 2023-04-01 DIAGNOSIS — Z3A32 32 weeks gestation of pregnancy: Secondary | ICD-10-CM

## 2023-04-01 MED ORDER — METRONIDAZOLE 500 MG PO TABS: 500.0000 mg | ORAL_TABLET | Freq: Two times a day (BID) | ORAL | 0 refills | Status: DC

## 2023-04-01 NOTE — Patient Instructions (Signed)

## 2023-04-01 NOTE — Procedures (Signed)
Amy Greene 02-07-1991 [redacted]w[redacted]d  Fetus A Non-Stress Test Interpretation for 04/01/23- NST with BPP  Indication: IUGR  Fetal Heart Rate A Mode: External Baseline Rate (A): 140 bpm Variability: Moderate Accelerations: 15 x 15 Decelerations: Variable Multiple birth?: No  Uterine Activity Mode: Toco Contraction Frequency (min): none Resting Tone Palpated: Relaxed  Interpretation (Fetal Testing) Nonstress Test Interpretation: Reactive Comments: Tracing reviewed byDr. Judeth Cornfield

## 2023-04-01 NOTE — Progress Notes (Signed)
   PRENATAL VISIT NOTE  Subjective:  Amy Greene is a 32 y.o. G2P1001 at [redacted]w[redacted]d being seen today for ongoing prenatal care.  She is currently monitored for the following issues for this high-risk pregnancy and has Delivery by emergency cesarean section; Encounter for supervision of normal first pregnancy in third trimester; Umbilical hernia without obstruction and without gangrene; Anemia complicating pregnancy, third trimester; Obesity affecting pregnancy; and IUGR (intrauterine growth restriction) affecting care of mother on their problem list.  Patient reports vaginal irritation.  Contractions: Irritability. Vag. Bleeding: None.  Movement: Present. Denies leaking of fluid.   The following portions of the patient's history were reviewed and updated as appropriate: allergies, current medications, past family history, past medical history, past social history, past surgical history and problem list.   Objective:   Vitals:   04/01/23 0849  BP: 102/68  Pulse: 87  Weight: 196 lb (88.9 kg)    Fetal Status: Fetal Heart Rate (bpm): 143 Fundal Height: 33 cm Movement: Present     General:  Alert, oriented and cooperative. Patient is in no acute distress.  Skin: Skin is warm and dry. No rash noted.   Cardiovascular: Normal heart rate noted  Respiratory: Normal respiratory effort, no problems with respiration noted  Abdomen: Soft, gravid, appropriate for gestational age.  Pain/Pressure: Present     Pelvic: Cervical exam deferred        Extremities: Normal range of motion.  Edema: None  Mental Status: Normal mood and affect. Normal behavior. Normal judgment and thought content.   Assessment and Plan:  Pregnancy: G2P1001 at [redacted]w[redacted]d 1. Encounter for supervision of normal first pregnancy in third trimester Patient is doing well Peds list provided  Undecided on contraception Third trimester labs with glucola today  2. Vaginal odor Self swab  3. Poor fetal growth affecting management of mother  in third trimester, single or unspecified fetus Continue antenatal testing EFW 3.3% on 8/22 Plan for delivery by 37 weeks or sooner pending next growth ultrasound  4. Delivery by emergency cesarean section Plans TOLAC- consent signed today  5. Anemia complicating pregnancy, third trimester Continue supplement  6. Obesity affecting pregnancy, antepartum, unspecified obesity type   Preterm labor symptoms and general obstetric precautions including but not limited to vaginal bleeding, contractions, leaking of fluid and fetal movement were reviewed in detail with the patient. Please refer to After Visit Summary for other counseling recommendations.   Return in about 2 weeks (around 04/15/2023) for in person, ROB, High risk.  Future Appointments  Date Time Provider Department Center  04/01/2023  9:35 AM Lean Jaeger, Gigi Gin, MD [facility]  04/01/2023 12:45 PM [facility] NURSE [facility]  04/01/2023  1:00 PM [facility] US1 [facility]  04/01/2023  2:15 PM [facility] NST [facility]  04/10/2023  2:30 PM [facility] NURSE [facility]  04/10/2023  2:45 PM [facility] US6 [facility]  04/10/2023  3:15 PM [facility] NST [facility]  04/17/2023  8:15 AM [facility] NURSE [facility]  04/17/2023  8:30 AM [facility] US6 [facility]  04/23/2023  2:15 PM [facility] NURSE [facility]  04/23/2023  2:30 PM [facility] US6 [facility]  04/23/2023  3:15 PM [facility] NST [facility]    Fareedah Mahler, MD

## 2023-04-02 LAB — CBC
Hematocrit: 29.5 % — ABNORMAL LOW (ref 34.0–46.6)
Hemoglobin: 9.6 g/dL — ABNORMAL LOW (ref 11.1–15.9)
MCH: 29.2 pg (ref 26.6–33.0)
MCHC: 32.5 g/dL (ref 31.5–35.7)
MCV: 90 fL (ref 79–97)
Platelets: 195 10*3/uL (ref 150–450)
RBC: 3.29 x10E6/uL — ABNORMAL LOW (ref 3.77–5.28)
RDW: 12.3 % (ref 11.7–15.4)
WBC: 6.8 10*3/uL (ref 3.4–10.8)

## 2023-04-02 LAB — GLUCOSE TOLERANCE, 2 HOURS W/ 1HR
Glucose, 1 hour: 166 mg/dL (ref 70–179)
Glucose, 2 hour: 102 mg/dL (ref 70–152)
Glucose, Fasting: 85 mg/dL (ref 70–91)

## 2023-04-02 LAB — CERVICOVAGINAL ANCILLARY ONLY
Bacterial Vaginitis (gardnerella): POSITIVE — AB
Candida Glabrata: NEGATIVE
Candida Vaginitis: NEGATIVE
Chlamydia: NEGATIVE
Comment: NEGATIVE
Comment: NEGATIVE
Comment: NEGATIVE
Comment: NEGATIVE
Comment: NEGATIVE
Comment: NORMAL
Neisseria Gonorrhea: NEGATIVE
Trichomonas: NEGATIVE

## 2023-04-02 LAB — RPR: RPR Ser Ql: NONREACTIVE

## 2023-04-02 LAB — HIV ANTIBODY (ROUTINE TESTING W REFLEX): HIV Screen 4th Generation wRfx: NONREACTIVE

## 2023-04-03 ENCOUNTER — Encounter: Payer: Self-pay | Admitting: *Deleted

## 2023-04-03 DIAGNOSIS — O24419 Gestational diabetes mellitus in pregnancy, unspecified control: Secondary | ICD-10-CM

## 2023-04-03 HISTORY — DX: Gestational diabetes mellitus in pregnancy, unspecified control: O24.419

## 2023-04-09 ENCOUNTER — Telehealth: Payer: Self-pay | Admitting: *Deleted

## 2023-04-09 ENCOUNTER — Ambulatory Visit: Payer: BLUE CROSS/BLUE SHIELD | Attending: Obstetrics and Gynecology

## 2023-04-09 ENCOUNTER — Ambulatory Visit: Payer: BLUE CROSS/BLUE SHIELD | Admitting: *Deleted

## 2023-04-09 ENCOUNTER — Encounter: Payer: Self-pay | Admitting: Family Medicine

## 2023-04-09 ENCOUNTER — Ambulatory Visit (HOSPITAL_BASED_OUTPATIENT_CLINIC_OR_DEPARTMENT_OTHER): Payer: No Typology Code available for payment source

## 2023-04-09 DIAGNOSIS — E669 Obesity, unspecified: Secondary | ICD-10-CM

## 2023-04-09 DIAGNOSIS — O365930 Maternal care for other known or suspected poor fetal growth, third trimester, not applicable or unspecified: Secondary | ICD-10-CM | POA: Insufficient documentation

## 2023-04-09 DIAGNOSIS — O99013 Anemia complicating pregnancy, third trimester: Secondary | ICD-10-CM

## 2023-04-09 DIAGNOSIS — D649 Anemia, unspecified: Secondary | ICD-10-CM

## 2023-04-09 DIAGNOSIS — O99213 Obesity complicating pregnancy, third trimester: Secondary | ICD-10-CM

## 2023-04-09 DIAGNOSIS — Z3A34 34 weeks gestation of pregnancy: Secondary | ICD-10-CM | POA: Insufficient documentation

## 2023-04-09 NOTE — Procedures (Signed)
Amy Greene 24-Mar-1991 [redacted]w[redacted]d  Fetus A Non-Stress Test Interpretation for 04/09/23-NST with BPP  Indication: IUGR  Fetal Heart Rate A Mode: External Baseline Rate (A): 135 bpm Variability: Moderate Accelerations: 15 x 15 Decelerations: None Multiple birth?: No  Uterine Activity Mode: Toco (not felt by pt) Contraction Frequency (min): 1 uc during NST Contraction Duration (sec): 50 Resting Tone Palpated: Relaxed  Interpretation (Fetal Testing) Nonstress Test Interpretation: Reactive Comments: Tracing reviewed byDr. Bess Harvest

## 2023-04-09 NOTE — Progress Notes (Signed)
Attempt to call patient to inform on results and Rx.  No answer and LVM for patient to call back. Will attempt to call patient again.  Felecia Shelling CMA

## 2023-04-09 NOTE — Telephone Encounter (Addendum)
-----   Message from [facility] sent at 04/02/2023  1:10 PM EDT ----- Please inform patient of BV infection. Rx e-prescribed  9/5  1612  Called pt and she did not answer. Message left stating that I am calling with test result information. Pt was asked to call back and leave message on nurse voicemail.

## 2023-04-10 ENCOUNTER — Ambulatory Visit: Payer: No Typology Code available for payment source

## 2023-04-10 ENCOUNTER — Other Ambulatory Visit: Payer: BLUE CROSS/BLUE SHIELD

## 2023-04-15 ENCOUNTER — Other Ambulatory Visit: Payer: Self-pay | Admitting: *Deleted

## 2023-04-15 ENCOUNTER — Other Ambulatory Visit: Payer: Self-pay | Admitting: Obstetrics and Gynecology

## 2023-04-15 ENCOUNTER — Ambulatory Visit: Payer: BLUE CROSS/BLUE SHIELD | Attending: Obstetrics and Gynecology

## 2023-04-15 ENCOUNTER — Ambulatory Visit (HOSPITAL_BASED_OUTPATIENT_CLINIC_OR_DEPARTMENT_OTHER): Payer: No Typology Code available for payment source

## 2023-04-15 VITALS — BP 117/59 | HR 90

## 2023-04-15 DIAGNOSIS — O365930 Maternal care for other known or suspected poor fetal growth, third trimester, not applicable or unspecified: Secondary | ICD-10-CM | POA: Insufficient documentation

## 2023-04-15 DIAGNOSIS — O99213 Obesity complicating pregnancy, third trimester: Secondary | ICD-10-CM

## 2023-04-15 DIAGNOSIS — E669 Obesity, unspecified: Secondary | ICD-10-CM | POA: Diagnosis not present

## 2023-04-15 DIAGNOSIS — Z3A35 35 weeks gestation of pregnancy: Secondary | ICD-10-CM | POA: Insufficient documentation

## 2023-04-15 DIAGNOSIS — O99013 Anemia complicating pregnancy, third trimester: Secondary | ICD-10-CM

## 2023-04-15 DIAGNOSIS — D649 Anemia, unspecified: Secondary | ICD-10-CM

## 2023-04-15 DIAGNOSIS — Z362 Encounter for other antenatal screening follow-up: Secondary | ICD-10-CM | POA: Insufficient documentation

## 2023-04-16 ENCOUNTER — Ambulatory Visit (INDEPENDENT_AMBULATORY_CARE_PROVIDER_SITE_OTHER): Payer: BLUE CROSS/BLUE SHIELD | Admitting: Family Medicine

## 2023-04-16 ENCOUNTER — Other Ambulatory Visit: Payer: Self-pay

## 2023-04-16 ENCOUNTER — Ambulatory Visit: Payer: BLUE CROSS/BLUE SHIELD

## 2023-04-16 VITALS — BP 106/70 | HR 88 | Wt 201.2 lb

## 2023-04-16 DIAGNOSIS — K429 Umbilical hernia without obstruction or gangrene: Secondary | ICD-10-CM

## 2023-04-16 DIAGNOSIS — O365930 Maternal care for other known or suspected poor fetal growth, third trimester, not applicable or unspecified: Secondary | ICD-10-CM

## 2023-04-16 DIAGNOSIS — Z3A35 35 weeks gestation of pregnancy: Secondary | ICD-10-CM

## 2023-04-16 DIAGNOSIS — Z3403 Encounter for supervision of normal first pregnancy, third trimester: Secondary | ICD-10-CM

## 2023-04-16 DIAGNOSIS — O99892 Other specified diseases and conditions complicating childbirth: Secondary | ICD-10-CM

## 2023-04-16 NOTE — Telephone Encounter (Signed)
Left message for pt.  Pt will be informed at her visit on 04/16/23.    Addison Naegeli, RN

## 2023-04-16 NOTE — Progress Notes (Signed)
   PRENATAL VISIT NOTE  Subjective:  Amy Greene is a 32 y.o. G2P1001 at [redacted]w[redacted]d being seen today for ongoing prenatal care.  She is currently monitored for the following issues for this low-risk pregnancy and has Delivery by emergency cesarean section; Encounter for supervision of normal first pregnancy in third trimester; Umbilical hernia without obstruction and without gangrene; Anemia complicating pregnancy, third trimester; Obesity affecting pregnancy; and IUGR (intrauterine growth restriction) affecting care of mother on their problem list.  Patient reports no complaints.  Contractions: Irritability. Vag. Bleeding: None.  Movement: Present. Denies leaking of fluid.   The following portions of the patient's history were reviewed and updated as appropriate: allergies, current medications, past family history, past medical history, past social history, past surgical history and problem list.   Objective:   Vitals:   04/16/23 1124  BP: 106/70  Pulse: 88  Weight: 201 lb 3.2 oz (91.3 kg)    Fetal Status: Fetal Heart Rate (bpm): 146 Fundal Height: 34 cm Movement: Present     General:  Alert, oriented and cooperative. Patient is in no acute distress.  Skin: Skin is warm and dry. No rash noted.   Cardiovascular: Normal heart rate noted  Respiratory: Normal respiratory effort, no problems with respiration noted  Abdomen: Soft, gravid, appropriate for gestational age.  Pain/Pressure: Present     Pelvic: Cervical exam deferred        Extremities: Normal range of motion.  Edema: Trace  Mental Status: Normal mood and affect. Normal behavior. Normal judgment and thought content.   Assessment and Plan:  Pregnancy: G2P1001 at [redacted]w[redacted]d 1. Delivery by emergency cesarean section Desires TOLAC - consent signed  2. Poor fetal growth affecting management of mother in third trimester, single or unspecified fetus In testing with MFM Growth @ 15%  3. Encounter for supervision of normal first pregnancy  in third trimester   4. Umbilical hernia without obstruction and without gangrene Wants repair pp  5. [redacted] weeks gestation of pregnancy   Preterm labor symptoms and general obstetric precautions including but not limited to vaginal bleeding, contractions, leaking of fluid and fetal movement were reviewed in detail with the patient. Please refer to After Visit Summary for other counseling recommendations.   Return in 1 week (on 04/23/2023).  Future Appointments  Date Time Provider Department Center  04/22/2023  3:55 PM Macon Large Jethro Bastos, MD [facility]  04/23/2023  2:15 PM [facility] NURSE [facility]  04/23/2023  2:30 PM [facility] US6 [facility]  04/29/2023  8:45 AM [facility] NST [facility]  04/30/2023  3:55 PM Hermina Staggers, MD [facility]  05/06/2023  2:30 PM [facility] US4 [facility]  05/07/2023  3:55 PM Hermina Staggers, MD [facility]    Reva Bores, MD

## 2023-04-17 ENCOUNTER — Ambulatory Visit: Payer: No Typology Code available for payment source

## 2023-04-21 ENCOUNTER — Encounter: Payer: Self-pay | Admitting: *Deleted

## 2023-04-22 ENCOUNTER — Encounter: Payer: Self-pay | Admitting: Obstetrics & Gynecology

## 2023-04-22 ENCOUNTER — Other Ambulatory Visit: Payer: Self-pay

## 2023-04-22 ENCOUNTER — Ambulatory Visit (INDEPENDENT_AMBULATORY_CARE_PROVIDER_SITE_OTHER): Payer: BLUE CROSS/BLUE SHIELD | Admitting: Obstetrics & Gynecology

## 2023-04-22 ENCOUNTER — Other Ambulatory Visit (HOSPITAL_COMMUNITY)
Admission: RE | Admit: 2023-04-22 | Discharge: 2023-04-22 | Disposition: A | Discharge status: Home / Self Care | Payer: BLUE CROSS/BLUE SHIELD | Source: Ambulatory Visit | Attending: Obstetrics & Gynecology | Admitting: Obstetrics & Gynecology

## 2023-04-22 VITALS — BP 112/71 | HR 94 | Wt 201.7 lb

## 2023-04-22 DIAGNOSIS — O23593 Infection of other part of genital tract in pregnancy, third trimester: Secondary | ICD-10-CM | POA: Diagnosis present

## 2023-04-22 DIAGNOSIS — O365930 Maternal care for other known or suspected poor fetal growth, third trimester, not applicable or unspecified: Secondary | ICD-10-CM

## 2023-04-22 DIAGNOSIS — O99820 Streptococcus B carrier state complicating pregnancy: Secondary | ICD-10-CM

## 2023-04-22 DIAGNOSIS — O0993 Supervision of high risk pregnancy, unspecified, third trimester: Secondary | ICD-10-CM

## 2023-04-22 DIAGNOSIS — Z3A36 36 weeks gestation of pregnancy: Secondary | ICD-10-CM

## 2023-04-22 NOTE — Progress Notes (Signed)
cm/s)   3.03       75    0.67       78    1.07       82     39.8      No      No ---------------------------------------------------------------------- Impression  Severe fetal growth restriction.  Patient returned for antenatal  testing.  Amniotic fluid is normal and good fetal activity seen.  Cephalic presentation.  Umbilical artery Doppler showed  normal forward diastolic flow.  NST is reactive.  BPP 10/10. ---------------------------------------------------------------------- Recommendations  -Continue weekly BPP, NST and UA Doppler studies till  delivery.  -We will recommend timing of delivery after next fetal growth  assessment.  ----------------------------------------------------------------------                 Noralee Space, MD Electronically Signed Final Report   04/01/2023 02:07 pm ----------------------------------------------------------------------   [place] MFM FETAL BPP WO NON STRESS  Result Date: 03/26/2023 ----------------------------------------------------------------------  OBSTETRICS REPORT                       (Signed Final 03/26/2023 04:31 pm) ---------------------------------------------------------------------- Patient Info  ID #:       [id number]                          D.O.B.:  [birth year] (32 yrs)  Name:       Amy Greene                     Visit Date: 03/26/2023 04:02 pm ---------------------------------------------------------------------- Performed By  Attending:        Noralee Space MD        Ref. Address:     [address]                                                             [place], [place]                                                             [address]  Performed By:     Alain Marion     [facility]                                     [facility]                                                             [facility]  Umbilical Artery   S/D     %tile      RI    %tile      PI    %tile     PSV    ADFV    RDFV                                                     (cm/s)   2.45       50    0.59       55    0.86       54    46.83      No      No ---------------------------------------------------------------------- Cervix Uterus Adnexa  Cervix  Not visualized (advanced GA >24wks) ---------------------------------------------------------------------- Comments  The patient is here for a follow-up ultrasound for FGR (now  resolved) at 35w 1d. EDD: 05/19/2023. Dating: Early  Ultrasound  (11/20/22). She has no concerns today.  Sonographic findings  Single intrauterine pregnancy.  Fetal cardiac activity:  Observed and appears normal.  Presentation: Cephalic.  Interval fetal anatomy appears normal.  Fetal biometry shows the estimated fetal weight at the 15  percentile and the abdominal circumference at the 12  percentile.  Amniotic fluid  volume: Within normal limits. AFI: 11.33 cm.  MVP: 4.57 cm.  Placenta: Posterior.  Umbilical artery dopplers findings:  -S/D:2.45 which are normal at this gestational age.  -Absent end-diastolic flow: No.  -Reversed end-diastolic flow:  No.  BPP 8/8.  Recommendations  - Continue weekly antenatal testing until delivery. No need for  dopplers since EFW/AC > 10%.  - Serial growth [place] every 3 weeks until delivery.  - Delivery pending clinical course and future growth.  There are limitations of prenatal ultrasound such as the  inability to detect certain abnormalities due to poor  visualization. Various factors such as fetal position,  gestational age and maternal body habitus may increase the  difficulty in visualizing the fetal anatomy. ----------------------------------------------------------------------                  Braxton Feathers, DO Electronically Signed Final Report   04/15/2023 04:16 pm ----------------------------------------------------------------------   [place] MFM FETAL BPP WO NON STRESS  Result Date: 04/15/2023 ----------------------------------------------------------------------  OBSTETRICS REPORT                       (Signed Final 04/15/2023 04:16 pm) ---------------------------------------------------------------------- Patient Info  ID #:       [id number]                          D.O.B.:  [birth year] (32 yrs)  Name:       Amy Greene                     Visit Date: 04/15/2023 03:32 pm ---------------------------------------------------------------------- Performed By  Attending:        Braxton Feathers DO       Ref. Address:     [address]  understands importance of weekly antenatal testing. ---------------------------------------------------------------------- Recommendations  - Weekly BPP, NST and UA Doppler still delivery.  -Delivery at [redacted] weeks gestation if severe fetal growth  restriction persists at growth assessment in 3 weeks.  ----------------------------------------------------------------------                 Noralee Space, MD Electronically Signed Final Report   03/26/2023 04:31 pm ----------------------------------------------------------------------   [place] MFM UA CORD DOPPLER  Result Date: 03/26/2023 ----------------------------------------------------------------------  OBSTETRICS REPORT                       (Signed Final 03/26/2023 04:31 pm) ---------------------------------------------------------------------- Patient Info  ID #:       [id number]                          D.O.B.:  08-21-1990 (32 yrs)  Name:       Amy Greene                     Visit Date: 03/26/2023 04:02 pm ---------------------------------------------------------------------- Performed By  Attending:        Noralee Space MD        Ref. Address:     [address]                                                             [place], [place]                                                             [address]  Performed By:     Alain Marion     Location[facility]  Referred By:      [facility] ---------------------------------------------------------------------- Orders  #  Description  [place] MFM FETAL BPP W/NONSTRESS  Result Date: 04/16/2023 ----------------------------------------------------------------------  OBSTETRICS REPORT                    (Corrected Final 04/16/2023 05:16 pm) ---------------------------------------------------------------------- Patient Info  ID #:       [id number]                          D.O.B.:  1990/10/30 (32 yrs)  Name:       Amy Greene                     Visit Date: 04/09/2023 09:33 am ---------------------------------------------------------------------- Performed By  Attending:        Noralee Space MD        Ref. Address:     [address]                                                             [place], [place]                                                             [address]  Performed By:     Alain Marion     Location:         [facility]  Referred By:      [facility] ---------------------------------------------------------------------- Orders  #  Description                           Code        Ordered By  1  [place] MFM UA CORD DOPPLER                76820.02    RAVI SHANKAR  2  [place] MFM FETAL BPP  Umbilical Artery   S/D     %tile      RI    %tile      PI    %tile     PSV    ADFV    RDFV                                                     (cm/s)   2.45       50    0.59       55    0.86       54    46.83      No      No ---------------------------------------------------------------------- Cervix Uterus Adnexa  Cervix  Not visualized (advanced GA >24wks) ---------------------------------------------------------------------- Comments  The patient is here for a follow-up ultrasound for FGR (now  resolved) at 35w 1d. EDD: 05/19/2023. Dating: Early  Ultrasound  (11/20/22). She has no concerns today.  Sonographic findings  Single intrauterine pregnancy.  Fetal cardiac activity:  Observed and appears normal.  Presentation: Cephalic.  Interval fetal anatomy appears normal.  Fetal biometry shows the estimated fetal weight at the 15  percentile and the abdominal circumference at the 12  percentile.  Amniotic fluid  volume: Within normal limits. AFI: 11.33 cm.  MVP: 4.57 cm.  Placenta: Posterior.  Umbilical artery dopplers findings:  -S/D:2.45 which are normal at this gestational age.  -Absent end-diastolic flow: No.  -Reversed end-diastolic flow:  No.  BPP 8/8.  Recommendations  - Continue weekly antenatal testing until delivery. No need for  dopplers since EFW/AC > 10%.  - Serial growth [place] every 3 weeks until delivery.  - Delivery pending clinical course and future growth.  There are limitations of prenatal ultrasound such as the  inability to detect certain abnormalities due to poor  visualization. Various factors such as fetal position,  gestational age and maternal body habitus may increase the  difficulty in visualizing the fetal anatomy. ----------------------------------------------------------------------                  Braxton Feathers, DO Electronically Signed Final Report   04/15/2023 04:16 pm ----------------------------------------------------------------------   [place] MFM FETAL BPP WO NON STRESS  Result Date: 04/15/2023 ----------------------------------------------------------------------  OBSTETRICS REPORT                       (Signed Final 04/15/2023 04:16 pm) ---------------------------------------------------------------------- Patient Info  ID #:       [id number]                          D.O.B.:  [birth year] (32 yrs)  Name:       Amy Greene                     Visit Date: 04/15/2023 03:32 pm ---------------------------------------------------------------------- Performed By  Attending:        Braxton Feathers DO       Ref. Address:     [address]  Umbilical Artery   S/D     %tile      RI    %tile      PI    %tile     PSV    ADFV    RDFV                                                     (cm/s)   2.45       50    0.59       55    0.86       54    46.83      No      No ---------------------------------------------------------------------- Cervix Uterus Adnexa  Cervix  Not visualized (advanced GA >24wks) ---------------------------------------------------------------------- Comments  The patient is here for a follow-up ultrasound for FGR (now  resolved) at 35w 1d. EDD: 05/19/2023. Dating: Early  Ultrasound  (11/20/22). She has no concerns today.  Sonographic findings  Single intrauterine pregnancy.  Fetal cardiac activity:  Observed and appears normal.  Presentation: Cephalic.  Interval fetal anatomy appears normal.  Fetal biometry shows the estimated fetal weight at the 15  percentile and the abdominal circumference at the 12  percentile.  Amniotic fluid  volume: Within normal limits. AFI: 11.33 cm.  MVP: 4.57 cm.  Placenta: Posterior.  Umbilical artery dopplers findings:  -S/D:2.45 which are normal at this gestational age.  -Absent end-diastolic flow: No.  -Reversed end-diastolic flow:  No.  BPP 8/8.  Recommendations  - Continue weekly antenatal testing until delivery. No need for  dopplers since EFW/AC > 10%.  - Serial growth [place] every 3 weeks until delivery.  - Delivery pending clinical course and future growth.  There are limitations of prenatal ultrasound such as the  inability to detect certain abnormalities due to poor  visualization. Various factors such as fetal position,  gestational age and maternal body habitus may increase the  difficulty in visualizing the fetal anatomy. ----------------------------------------------------------------------                  Braxton Feathers, DO Electronically Signed Final Report   04/15/2023 04:16 pm ----------------------------------------------------------------------   [place] MFM FETAL BPP WO NON STRESS  Result Date: 04/15/2023 ----------------------------------------------------------------------  OBSTETRICS REPORT                       (Signed Final 04/15/2023 04:16 pm) ---------------------------------------------------------------------- Patient Info  ID #:       [id number]                          D.O.B.:  [birth year] (32 yrs)  Name:       Amy Greene                     Visit Date: 04/15/2023 03:32 pm ---------------------------------------------------------------------- Performed By  Attending:        Braxton Feathers DO       Ref. Address:     [address]  [place] MFM FETAL BPP W/NONSTRESS  Result Date: 04/16/2023 ----------------------------------------------------------------------  OBSTETRICS REPORT                    (Corrected Final 04/16/2023 05:16 pm) ---------------------------------------------------------------------- Patient Info  ID #:       [id number]                          D.O.B.:  1990/10/30 (32 yrs)  Name:       Amy Greene                     Visit Date: 04/09/2023 09:33 am ---------------------------------------------------------------------- Performed By  Attending:        Noralee Space MD        Ref. Address:     [address]                                                             [place], [place]                                                             [address]  Performed By:     Alain Marion     Location[facility]  Referred By:      [facility] ---------------------------------------------------------------------- Orders  #  Description                           Code        Ordered By  1  [place] MFM UA CORD DOPPLER                76820.02    RAVI SHANKAR  2  [place] MFM FETAL BPP  cm/s)   3.03       75    0.67       78    1.07       82     39.8      No      No ---------------------------------------------------------------------- Impression  Severe fetal growth restriction.  Patient returned for antenatal  testing.  Amniotic fluid is normal and good fetal activity seen.  Cephalic presentation.  Umbilical artery Doppler showed  normal forward diastolic flow.  NST is reactive.  BPP 10/10. ---------------------------------------------------------------------- Recommendations  -Continue weekly BPP, NST and UA Doppler studies till  delivery.  -We will recommend timing of delivery after next fetal growth  assessment.  ----------------------------------------------------------------------                 Noralee Space, MD Electronically Signed Final Report   04/01/2023 02:07 pm ----------------------------------------------------------------------   [place] MFM FETAL BPP WO NON STRESS  Result Date: 03/26/2023 ----------------------------------------------------------------------  OBSTETRICS REPORT                       (Signed Final 03/26/2023 04:31 pm) ---------------------------------------------------------------------- Patient Info  ID #:       [id number]                          D.O.B.:  [birth year] (32 yrs)  Name:       Amy Greene                     Visit Date: 03/26/2023 04:02 pm ---------------------------------------------------------------------- Performed By  Attending:        Noralee Space MD        Ref. Address:     [address]                                                             [place], [place]                                                             [address]  Performed By:     Alain Marion     Location:         [facility]  Umbilical Artery   S/D     %tile      RI    %tile      PI    %tile     PSV    ADFV    RDFV                                                     (cm/s)   2.45       50    0.59       55    0.86       54    46.83      No      No ---------------------------------------------------------------------- Cervix Uterus Adnexa  Cervix  Not visualized (advanced GA >24wks) ---------------------------------------------------------------------- Comments  The patient is here for a follow-up ultrasound for FGR (now  resolved) at 35w 1d. EDD: 05/19/2023. Dating: Early  Ultrasound  (11/20/22). She has no concerns today.  Sonographic findings  Single intrauterine pregnancy.  Fetal cardiac activity:  Observed and appears normal.  Presentation: Cephalic.  Interval fetal anatomy appears normal.  Fetal biometry shows the estimated fetal weight at the 15  percentile and the abdominal circumference at the 12  percentile.  Amniotic fluid  volume: Within normal limits. AFI: 11.33 cm.  MVP: 4.57 cm.  Placenta: Posterior.  Umbilical artery dopplers findings:  -S/D:2.45 which are normal at this gestational age.  -Absent end-diastolic flow: No.  -Reversed end-diastolic flow:  No.  BPP 8/8.  Recommendations  - Continue weekly antenatal testing until delivery. No need for  dopplers since EFW/AC > 10%.  - Serial growth [place] every 3 weeks until delivery.  - Delivery pending clinical course and future growth.  There are limitations of prenatal ultrasound such as the  inability to detect certain abnormalities due to poor  visualization. Various factors such as fetal position,  gestational age and maternal body habitus may increase the  difficulty in visualizing the fetal anatomy. ----------------------------------------------------------------------                  Braxton Feathers, DO Electronically Signed Final Report   04/15/2023 04:16 pm ----------------------------------------------------------------------   [place] MFM FETAL BPP WO NON STRESS  Result Date: 04/15/2023 ----------------------------------------------------------------------  OBSTETRICS REPORT                       (Signed Final 04/15/2023 04:16 pm) ---------------------------------------------------------------------- Patient Info  ID #:       [id number]                          D.O.B.:  [birth year] (32 yrs)  Name:       Amy Greene                     Visit Date: 04/15/2023 03:32 pm ---------------------------------------------------------------------- Performed By  Attending:        Braxton Feathers DO       Ref. Address:     [address]  abnormalities due to poor  visualization. Various factors such as fetal position,  gestational age and maternal body habitus may increase the  difficulty in visualizing the fetal anatomy. ----------------------------------------------------------------------                  Braxton Feathers, DO Electronically Signed Final Report   04/15/2023 04:16 pm ----------------------------------------------------------------------   [place] MFM UA CORD DOPPLER  Result Date: 04/15/2023 ----------------------------------------------------------------------  OBSTETRICS REPORT                       (Signed Final 04/15/2023 04:16 pm) ---------------------------------------------------------------------- Patient Info  ID #:       [id number]                          D.O.B.:  August 26, 1990 (32 yrs)  Name:       Amy Greene                     Visit Date: 04/15/2023 03:32 pm ---------------------------------------------------------------------- Performed By  Attending:        Braxton Feathers DO       Ref. Address:     [address]                                                             [place], [place]                                                             [address]  Performed By:     Anabel Halon          Location[facility]  Referred By:      [facility] ---------------------------------------------------------------------- Orders  #  Description  understands importance of weekly antenatal testing. ---------------------------------------------------------------------- Recommendations  - Weekly BPP, NST and UA Doppler still delivery.  -Delivery at [redacted] weeks gestation if severe fetal growth  restriction persists at growth assessment in 3 weeks.  ----------------------------------------------------------------------                 Noralee Space, MD Electronically Signed Final Report   03/26/2023 04:31 pm ----------------------------------------------------------------------   [place] MFM UA CORD DOPPLER  Result Date: 03/26/2023 ----------------------------------------------------------------------  OBSTETRICS REPORT                       (Signed Final 03/26/2023 04:31 pm) ---------------------------------------------------------------------- Patient Info  ID #:       [id number]                          D.O.B.:  08-21-1990 (32 yrs)  Name:       Amy Greene                     Visit Date: 03/26/2023 04:02 pm ---------------------------------------------------------------------- Performed By  Attending:        Noralee Space MD        Ref. Address:     [address]                                                             [place], [place]                                                             [address]  Performed By:     Alain Marion     Location[facility]  Referred By:      [facility] ---------------------------------------------------------------------- Orders  #  Description  Umbilical Artery   S/D     %tile      RI    %tile      PI    %tile     PSV    ADFV    RDFV                                                     (cm/s)   2.45       50    0.59       55    0.86       54    46.83      No      No ---------------------------------------------------------------------- Cervix Uterus Adnexa  Cervix  Not visualized (advanced GA >24wks) ---------------------------------------------------------------------- Comments  The patient is here for a follow-up ultrasound for FGR (now  resolved) at 35w 1d. EDD: 05/19/2023. Dating: Early  Ultrasound  (11/20/22). She has no concerns today.  Sonographic findings  Single intrauterine pregnancy.  Fetal cardiac activity:  Observed and appears normal.  Presentation: Cephalic.  Interval fetal anatomy appears normal.  Fetal biometry shows the estimated fetal weight at the 15  percentile and the abdominal circumference at the 12  percentile.  Amniotic fluid  volume: Within normal limits. AFI: 11.33 cm.  MVP: 4.57 cm.  Placenta: Posterior.  Umbilical artery dopplers findings:  -S/D:2.45 which are normal at this gestational age.  -Absent end-diastolic flow: No.  -Reversed end-diastolic flow:  No.  BPP 8/8.  Recommendations  - Continue weekly antenatal testing until delivery. No need for  dopplers since EFW/AC > 10%.  - Serial growth [place] every 3 weeks until delivery.  - Delivery pending clinical course and future growth.  There are limitations of prenatal ultrasound such as the  inability to detect certain abnormalities due to poor  visualization. Various factors such as fetal position,  gestational age and maternal body habitus may increase the  difficulty in visualizing the fetal anatomy. ----------------------------------------------------------------------                  Braxton Feathers, DO Electronically Signed Final Report   04/15/2023 04:16 pm ----------------------------------------------------------------------   [place] MFM FETAL BPP WO NON STRESS  Result Date: 04/15/2023 ----------------------------------------------------------------------  OBSTETRICS REPORT                       (Signed Final 04/15/2023 04:16 pm) ---------------------------------------------------------------------- Patient Info  ID #:       [id number]                          D.O.B.:  [birth year] (32 yrs)  Name:       Amy Greene                     Visit Date: 04/15/2023 03:32 pm ---------------------------------------------------------------------- Performed By  Attending:        Braxton Feathers DO       Ref. Address:     [address]  understands importance of weekly antenatal testing. ---------------------------------------------------------------------- Recommendations  - Weekly BPP, NST and UA Doppler still delivery.  -Delivery at [redacted] weeks gestation if severe fetal growth  restriction persists at growth assessment in 3 weeks.  ----------------------------------------------------------------------                 Noralee Space, MD Electronically Signed Final Report   03/26/2023 04:31 pm ----------------------------------------------------------------------   [place] MFM UA CORD DOPPLER  Result Date: 03/26/2023 ----------------------------------------------------------------------  OBSTETRICS REPORT                       (Signed Final 03/26/2023 04:31 pm) ---------------------------------------------------------------------- Patient Info  ID #:       [id number]                          D.O.B.:  08-21-1990 (32 yrs)  Name:       Amy Greene                     Visit Date: 03/26/2023 04:02 pm ---------------------------------------------------------------------- Performed By  Attending:        Noralee Space MD        Ref. Address:     [address]                                                             [place], [place]                                                             [address]  Performed By:     Alain Marion     [facility]  Referred By:      [facility] ---------------------------------------------------------------------- Orders  #  Description  Umbilical Artery   S/D     %tile      RI    %tile      PI    %tile     PSV    ADFV    RDFV                                                     (cm/s)   2.45       50    0.59       55    0.86       54    46.83      No      No ---------------------------------------------------------------------- Cervix Uterus Adnexa  Cervix  Not visualized (advanced GA >24wks) ---------------------------------------------------------------------- Comments  The patient is here for a follow-up ultrasound for FGR (now  resolved) at 35w 1d. EDD: 05/19/2023. Dating: Early  Ultrasound  (11/20/22). She has no concerns today.  Sonographic findings  Single intrauterine pregnancy.  Fetal cardiac activity:  Observed and appears normal.  Presentation: Cephalic.  Interval fetal anatomy appears normal.  Fetal biometry shows the estimated fetal weight at the 15  percentile and the abdominal circumference at the 12  percentile.  Amniotic fluid  volume: Within normal limits. AFI: 11.33 cm.  MVP: 4.57 cm.  Placenta: Posterior.  Umbilical artery dopplers findings:  -S/D:2.45 which are normal at this gestational age.  -Absent end-diastolic flow: No.  -Reversed end-diastolic flow:  No.  BPP 8/8.  Recommendations  - Continue weekly antenatal testing until delivery. No need for  dopplers since EFW/AC > 10%.  - Serial growth [place] every 3 weeks until delivery.  - Delivery pending clinical course and future growth.  There are limitations of prenatal ultrasound such as the  inability to detect certain abnormalities due to poor  visualization. Various factors such as fetal position,  gestational age and maternal body habitus may increase the  difficulty in visualizing the fetal anatomy. ----------------------------------------------------------------------                  Braxton Feathers, DO Electronically Signed Final Report   04/15/2023 04:16 pm ----------------------------------------------------------------------   [place] MFM FETAL BPP WO NON STRESS  Result Date: 04/15/2023 ----------------------------------------------------------------------  OBSTETRICS REPORT                       (Signed Final 04/15/2023 04:16 pm) ---------------------------------------------------------------------- Patient Info  ID #:       [id number]                          D.O.B.:  [birth year] (32 yrs)  Name:       Amy Greene                     Visit Date: 04/15/2023 03:32 pm ---------------------------------------------------------------------- Performed By  Attending:        Braxton Feathers DO       Ref. Address:     [address]  Umbilical Artery   S/D     %tile      RI    %tile      PI    %tile     PSV    ADFV    RDFV                                                     (cm/s)   2.45       50    0.59       55    0.86       54    46.83      No      No ---------------------------------------------------------------------- Cervix Uterus Adnexa  Cervix  Not visualized (advanced GA >24wks) ---------------------------------------------------------------------- Comments  The patient is here for a follow-up ultrasound for FGR (now  resolved) at 35w 1d. EDD: 05/19/2023. Dating: Early  Ultrasound  (11/20/22). She has no concerns today.  Sonographic findings  Single intrauterine pregnancy.  Fetal cardiac activity:  Observed and appears normal.  Presentation: Cephalic.  Interval fetal anatomy appears normal.  Fetal biometry shows the estimated fetal weight at the 15  percentile and the abdominal circumference at the 12  percentile.  Amniotic fluid  volume: Within normal limits. AFI: 11.33 cm.  MVP: 4.57 cm.  Placenta: Posterior.  Umbilical artery dopplers findings:  -S/D:2.45 which are normal at this gestational age.  -Absent end-diastolic flow: No.  -Reversed end-diastolic flow:  No.  BPP 8/8.  Recommendations  - Continue weekly antenatal testing until delivery. No need for  dopplers since EFW/AC > 10%.  - Serial growth [place] every 3 weeks until delivery.  - Delivery pending clinical course and future growth.  There are limitations of prenatal ultrasound such as the  inability to detect certain abnormalities due to poor  visualization. Various factors such as fetal position,  gestational age and maternal body habitus may increase the  difficulty in visualizing the fetal anatomy. ----------------------------------------------------------------------                  Braxton Feathers, DO Electronically Signed Final Report   04/15/2023 04:16 pm ----------------------------------------------------------------------   [place] MFM FETAL BPP WO NON STRESS  Result Date: 04/15/2023 ----------------------------------------------------------------------  OBSTETRICS REPORT                       (Signed Final 04/15/2023 04:16 pm) ---------------------------------------------------------------------- Patient Info  ID #:       [id number]                          D.O.B.:  [birth year] (32 yrs)  Name:       Amy Greene                     Visit Date: 04/15/2023 03:32 pm ---------------------------------------------------------------------- Performed By  Attending:        Braxton Feathers DO       Ref. Address:     [address]  [place] MFM FETAL BPP W/NONSTRESS  Result Date: 04/16/2023 ----------------------------------------------------------------------  OBSTETRICS REPORT                    (Corrected Final 04/16/2023 05:16 pm) ---------------------------------------------------------------------- Patient Info  ID #:       [id number]                          D.O.B.:  1990/10/30 (32 yrs)  Name:       Amy Greene                     Visit Date: 04/09/2023 09:33 am ---------------------------------------------------------------------- Performed By  Attending:        Noralee Space MD        Ref. Address:     [address]                                                             [place], [place]                                                             [address]  Performed By:     Alain Marion     Location[facility]                                     [facility]  Referred By:      [facility] ---------------------------------------------------------------------- Orders  #  Description                           Code        Ordered By  1  [place] MFM UA CORD DOPPLER                76820.02    RAVI SHANKAR  2  [place] MFM FETAL BPP  Umbilical Artery   S/D     %tile      RI    %tile      PI    %tile     PSV    ADFV    RDFV                                                     (cm/s)   2.45       50    0.59       55    0.86       54    46.83      No      No ---------------------------------------------------------------------- Cervix Uterus Adnexa  Cervix  Not visualized (advanced GA >24wks) ---------------------------------------------------------------------- Comments  The patient is here for a follow-up ultrasound for FGR (now  resolved) at 35w 1d. EDD: 05/19/2023. Dating: Early  Ultrasound  (11/20/22). She has no concerns today.  Sonographic findings  Single intrauterine pregnancy.  Fetal cardiac activity:  Observed and appears normal.  Presentation: Cephalic.  Interval fetal anatomy appears normal.  Fetal biometry shows the estimated fetal weight at the 15  percentile and the abdominal circumference at the 12  percentile.  Amniotic fluid  volume: Within normal limits. AFI: 11.33 cm.  MVP: 4.57 cm.  Placenta: Posterior.  Umbilical artery dopplers findings:  -S/D:2.45 which are normal at this gestational age.  -Absent end-diastolic flow: No.  -Reversed end-diastolic flow:  No.  BPP 8/8.  Recommendations  - Continue weekly antenatal testing until delivery. No need for  dopplers since EFW/AC > 10%.  - Serial growth [place] every 3 weeks until delivery.  - Delivery pending clinical course and future growth.  There are limitations of prenatal ultrasound such as the  inability to detect certain abnormalities due to poor  visualization. Various factors such as fetal position,  gestational age and maternal body habitus may increase the  difficulty in visualizing the fetal anatomy. ----------------------------------------------------------------------                  Braxton Feathers, DO Electronically Signed Final Report   04/15/2023 04:16 pm ----------------------------------------------------------------------   [place] MFM FETAL BPP WO NON STRESS  Result Date: 04/15/2023 ----------------------------------------------------------------------  OBSTETRICS REPORT                       (Signed Final 04/15/2023 04:16 pm) ---------------------------------------------------------------------- Patient Info  ID #:       [id number]                          D.O.B.:  [birth year] (32 yrs)  Name:       Amy Greene                     Visit Date: 04/15/2023 03:32 pm ---------------------------------------------------------------------- Performed By  Attending:        Braxton Feathers DO       Ref. Address:     [address]  understands importance of weekly antenatal testing. ---------------------------------------------------------------------- Recommendations  - Weekly BPP, NST and UA Doppler still delivery.  -Delivery at [redacted] weeks gestation if severe fetal growth  restriction persists at growth assessment in 3 weeks.  ----------------------------------------------------------------------                 Noralee Space, MD Electronically Signed Final Report   03/26/2023 04:31 pm ----------------------------------------------------------------------   [place] MFM UA CORD DOPPLER  Result Date: 03/26/2023 ----------------------------------------------------------------------  OBSTETRICS REPORT                       (Signed Final 03/26/2023 04:31 pm) ---------------------------------------------------------------------- Patient Info  ID #:       [id number]                          D.O.B.:  08-21-1990 (32 yrs)  Name:       Amy Greene                     Visit Date: 03/26/2023 04:02 pm ---------------------------------------------------------------------- Performed By  Attending:        Noralee Space MD        Ref. Address:     [address]                                                             [place], [place]                                                             [address]  Performed By:     Alain Marion     [facility]  Referred By:      [facility] ---------------------------------------------------------------------- Orders  #  Description  [place] MFM FETAL BPP W/NONSTRESS  Result Date: 04/16/2023 ----------------------------------------------------------------------  OBSTETRICS REPORT                    (Corrected Final 04/16/2023 05:16 pm) ---------------------------------------------------------------------- Patient Info  ID #:       [id number]                          D.O.B.:  1990/10/30 (32 yrs)  Name:       Amy Greene                     Visit Date: 04/09/2023 09:33 am ---------------------------------------------------------------------- Performed By  Attending:        Noralee Space MD        Ref. Address:     [address]                                                             [place], [place]                                                             [address]  Performed By:     Alain Marion     [facility]  Referred By:      [facility] ---------------------------------------------------------------------- Orders  #  Description                           Code        Ordered By  1  [place] MFM UA CORD DOPPLER                76820.02    RAVI SHANKAR  2  [place] MFM FETAL BPP  cm/s)   3.03       75    0.67       78    1.07       82     39.8      No      No ---------------------------------------------------------------------- Impression  Severe fetal growth restriction.  Patient returned for antenatal  testing.  Amniotic fluid is normal and good fetal activity seen.  Cephalic presentation.  Umbilical artery Doppler showed  normal forward diastolic flow.  NST is reactive.  BPP 10/10. ---------------------------------------------------------------------- Recommendations  -Continue weekly BPP, NST and UA Doppler studies till  delivery.  -We will recommend timing of delivery after next fetal growth  assessment.  ----------------------------------------------------------------------                 Noralee Space, MD Electronically Signed Final Report   04/01/2023 02:07 pm ----------------------------------------------------------------------   [place] MFM FETAL BPP WO NON STRESS  Result Date: 03/26/2023 ----------------------------------------------------------------------  OBSTETRICS REPORT                       (Signed Final 03/26/2023 04:31 pm) ---------------------------------------------------------------------- Patient Info  ID #:       [id number]                          D.O.B.:  [birth year] (32 yrs)  Name:       Amy Greene                     Visit Date: 03/26/2023 04:02 pm ---------------------------------------------------------------------- Performed By  Attending:        Noralee Space MD        Ref. Address:     [address]                                                             [place], [place]                                                             [address]  Performed By:     Alain Marion     Location[facility]         [facility]  understands importance of weekly antenatal testing. ---------------------------------------------------------------------- Recommendations  - Weekly BPP, NST and UA Doppler still delivery.  -Delivery at [redacted] weeks gestation if severe fetal growth  restriction persists at growth assessment in 3 weeks.  ----------------------------------------------------------------------                 Noralee Space, MD Electronically Signed Final Report   03/26/2023 04:31 pm ----------------------------------------------------------------------   [place] MFM UA CORD DOPPLER  Result Date: 03/26/2023 ----------------------------------------------------------------------  OBSTETRICS REPORT                       (Signed Final 03/26/2023 04:31 pm) ---------------------------------------------------------------------- Patient Info  ID #:       [id number]                          D.O.B.:  08-21-1990 (32 yrs)  Name:       Amy Greene                     Visit Date: 03/26/2023 04:02 pm ---------------------------------------------------------------------- Performed By  Attending:        Noralee Space MD        Ref. Address:     [address]                                                             [place], [place]                                                             [address]  Performed By:     Alain Marion     Location[facility]  Referred By:      [facility] ---------------------------------------------------------------------- Orders  #  Description  understands importance of weekly antenatal testing. ---------------------------------------------------------------------- Recommendations  - Weekly BPP, NST and UA Doppler still delivery.  -Delivery at [redacted] weeks gestation if severe fetal growth  restriction persists at growth assessment in 3 weeks.  ----------------------------------------------------------------------                 Noralee Space, MD Electronically Signed Final Report   03/26/2023 04:31 pm ----------------------------------------------------------------------   [place] MFM UA CORD DOPPLER  Result Date: 03/26/2023 ----------------------------------------------------------------------  OBSTETRICS REPORT                       (Signed Final 03/26/2023 04:31 pm) ---------------------------------------------------------------------- Patient Info  ID #:       [id number]                          D.O.B.:  08-21-1990 (32 yrs)  Name:       Amy Greene                     Visit Date: 03/26/2023 04:02 pm ---------------------------------------------------------------------- Performed By  Attending:        Noralee Space MD        Ref. Address:     [address]                                                             [place], [place]                                                             [address]  Performed By:     Alain Marion     Location[facility]  Referred By:      [facility] ---------------------------------------------------------------------- Orders  #  Description  abnormalities due to poor  visualization. Various factors such as fetal position,  gestational age and maternal body habitus may increase the  difficulty in visualizing the fetal anatomy. ----------------------------------------------------------------------                  Braxton Feathers, DO Electronically Signed Final Report   04/15/2023 04:16 pm ----------------------------------------------------------------------   [place] MFM UA CORD DOPPLER  Result Date: 04/15/2023 ----------------------------------------------------------------------  OBSTETRICS REPORT                       (Signed Final 04/15/2023 04:16 pm) ---------------------------------------------------------------------- Patient Info  ID #:       [id number]                          D.O.B.:  August 26, 1990 (32 yrs)  Name:       Amy Greene                     Visit Date: 04/15/2023 03:32 pm ---------------------------------------------------------------------- Performed By  Attending:        Braxton Feathers DO       Ref. Address:     [address]                                                             [place], [place]                                                             [address]  Performed By:     Anabel Halon          Location:         [facility]                                     [facility]  Referred By:      [facility] ---------------------------------------------------------------------- Orders  #  Description  understands importance of weekly antenatal testing. ---------------------------------------------------------------------- Recommendations  - Weekly BPP, NST and UA Doppler still delivery.  -Delivery at [redacted] weeks gestation if severe fetal growth  restriction persists at growth assessment in 3 weeks.  ----------------------------------------------------------------------                 Noralee Space, MD Electronically Signed Final Report   03/26/2023 04:31 pm ----------------------------------------------------------------------   [place] MFM UA CORD DOPPLER  Result Date: 03/26/2023 ----------------------------------------------------------------------  OBSTETRICS REPORT                       (Signed Final 03/26/2023 04:31 pm) ---------------------------------------------------------------------- Patient Info  ID #:       [id number]                          D.O.B.:  08-21-1990 (32 yrs)  Name:       Amy Greene                     Visit Date: 03/26/2023 04:02 pm ---------------------------------------------------------------------- Performed By  Attending:        Noralee Space MD        Ref. Address:     [address]                                                             [place], [place]                                                             [address]  Performed By:     Alain Marion     [facility]                                     [facility]  Referred By:      [facility] ---------------------------------------------------------------------- Orders  #  Description  [place] MFM FETAL BPP W/NONSTRESS  Result Date: 04/16/2023 ----------------------------------------------------------------------  OBSTETRICS REPORT                    (Corrected Final 04/16/2023 05:16 pm) ---------------------------------------------------------------------- Patient Info  ID #:       [id number]                          D.O.B.:  1990/10/30 (32 yrs)  Name:       Amy Greene                     Visit Date: 04/09/2023 09:33 am ---------------------------------------------------------------------- Performed By  Attending:        Noralee Space MD        Ref. Address:     [address]                                                             [place], [place]                                                             [address]  Performed By:     Alain Marion     Location[facility]                                     [facility]  Referred By:      [facility] ---------------------------------------------------------------------- Orders  #  Description                           Code        Ordered By  1  [place] MFM UA CORD DOPPLER                76820.02    RAVI SHANKAR  2  [place] MFM FETAL BPP  [place] MFM FETAL BPP W/NONSTRESS  Result Date: 04/16/2023 ----------------------------------------------------------------------  OBSTETRICS REPORT                    (Corrected Final 04/16/2023 05:16 pm) ---------------------------------------------------------------------- Patient Info  ID #:       [id number]                          D.O.B.:  1990/10/30 (32 yrs)  Name:       Amy Greene                     Visit Date: 04/09/2023 09:33 am ---------------------------------------------------------------------- Performed By  Attending:        Noralee Space MD        Ref. Address:     [address]                                                             [place], [place]                                                             [address]  Performed By:     Alain Marion     Location[facility]  Referred By:      [facility] ---------------------------------------------------------------------- Orders  #  Description                           Code        Ordered By  1  [place] MFM UA CORD DOPPLER                76820.02    RAVI SHANKAR  2  [place] MFM FETAL BPP  [place] MFM FETAL BPP W/NONSTRESS  Result Date: 04/16/2023 ----------------------------------------------------------------------  OBSTETRICS REPORT                    (Corrected Final 04/16/2023 05:16 pm) ---------------------------------------------------------------------- Patient Info  ID #:       [id number]                          D.O.B.:  1990/10/30 (32 yrs)  Name:       Amy Greene                     Visit Date: 04/09/2023 09:33 am ---------------------------------------------------------------------- Performed By  Attending:        Noralee Space MD        Ref. Address:     [address]                                                             [place], [place]                                                             [address]  Performed By:     Alain Marion     Location:         [facility]                                     [facility]  Referred By:      [facility] ---------------------------------------------------------------------- Orders  #  Description                           Code        Ordered By  1  [place] MFM UA CORD DOPPLER                76820.02    RAVI SHANKAR  2  [place] MFM FETAL BPP  understands importance of weekly antenatal testing. ---------------------------------------------------------------------- Recommendations  - Weekly BPP, NST and UA Doppler still delivery.  -Delivery at [redacted] weeks gestation if severe fetal growth  restriction persists at growth assessment in 3 weeks.  ----------------------------------------------------------------------                 Noralee Space, MD Electronically Signed Final Report   03/26/2023 04:31 pm ----------------------------------------------------------------------   [place] MFM UA CORD DOPPLER  Result Date: 03/26/2023 ----------------------------------------------------------------------  OBSTETRICS REPORT                       (Signed Final 03/26/2023 04:31 pm) ---------------------------------------------------------------------- Patient Info  ID #:       [id number]                          D.O.B.:  08-21-1990 (32 yrs)  Name:       Amy Greene                     Visit Date: 03/26/2023 04:02 pm ---------------------------------------------------------------------- Performed By  Attending:        Noralee Space MD        Ref. Address:     [address]                                                             [place], [place]                                                             [address]  Performed By:     Alain Marion     Location:         [facility]                                     [facility]  Referred By:      [facility] ---------------------------------------------------------------------- Orders  #  Description  [place] MFM FETAL BPP W/NONSTRESS  Result Date: 04/16/2023 ----------------------------------------------------------------------  OBSTETRICS REPORT                    (Corrected Final 04/16/2023 05:16 pm) ---------------------------------------------------------------------- Patient Info  ID #:       [id number]                          D.O.B.:  1990/10/30 (32 yrs)  Name:       Amy Greene                     Visit Date: 04/09/2023 09:33 am ---------------------------------------------------------------------- Performed By  Attending:        Noralee Space MD        Ref. Address:     [address]                                                             [place], [place]                                                             [address]  Performed By:     Alain Marion     Location:         [facility]  Referred By:      [facility] ---------------------------------------------------------------------- Orders  #  Description                           Code        Ordered By  1  [place] MFM UA CORD DOPPLER                76820.02    RAVI SHANKAR  2  [place] MFM FETAL BPP  cm/s)   3.03       75    0.67       78    1.07       82     39.8      No      No ---------------------------------------------------------------------- Impression  Severe fetal growth restriction.  Patient returned for antenatal  testing.  Amniotic fluid is normal and good fetal activity seen.  Cephalic presentation.  Umbilical artery Doppler showed  normal forward diastolic flow.  NST is reactive.  BPP 10/10. ---------------------------------------------------------------------- Recommendations  -Continue weekly BPP, NST and UA Doppler studies till  delivery.  -We will recommend timing of delivery after next fetal growth  assessment.  ----------------------------------------------------------------------                 Noralee Space, MD Electronically Signed Final Report   04/01/2023 02:07 pm ----------------------------------------------------------------------   [place] MFM FETAL BPP WO NON STRESS  Result Date: 03/26/2023 ----------------------------------------------------------------------  OBSTETRICS REPORT                       (Signed Final 03/26/2023 04:31 pm) ---------------------------------------------------------------------- Patient Info  ID #:       [id number]                          D.O.B.:  [birth year] (32 yrs)  Name:       Amy Greene                     Visit Date: 03/26/2023 04:02 pm ---------------------------------------------------------------------- Performed By  Attending:        Noralee Space MD        Ref. Address:     [address]                                                             [place], [place]                                                             [address]  Performed By:     Alain Marion     Location:         [facility]                                     [facility]  cm/s)   3.03       75    0.67       78    1.07       82     39.8      No      No ---------------------------------------------------------------------- Impression  Severe fetal growth restriction.  Patient returned for antenatal  testing.  Amniotic fluid is normal and good fetal activity seen.  Cephalic presentation.  Umbilical artery Doppler showed  normal forward diastolic flow.  NST is reactive.  BPP 10/10. ---------------------------------------------------------------------- Recommendations  -Continue weekly BPP, NST and UA Doppler studies till  delivery.  -We will recommend timing of delivery after next fetal growth  assessment.  ----------------------------------------------------------------------                 Noralee Space, MD Electronically Signed Final Report   04/01/2023 02:07 pm ----------------------------------------------------------------------   [place] MFM FETAL BPP WO NON STRESS  Result Date: 03/26/2023 ----------------------------------------------------------------------  OBSTETRICS REPORT                       (Signed Final 03/26/2023 04:31 pm) ---------------------------------------------------------------------- Patient Info  ID #:       [id number]                          D.O.B.:  [birth year] (32 yrs)  Name:       Amy Greene                     Visit Date: 03/26/2023 04:02 pm ---------------------------------------------------------------------- Performed By  Attending:        Noralee Space MD        Ref. Address:     [address]                                                             [place], [place]                                                             [address]  Performed By:     Alain Marion     Location:         [facility]  understands importance of weekly antenatal testing. ---------------------------------------------------------------------- Recommendations  - Weekly BPP, NST and UA Doppler still delivery.  -Delivery at [redacted] weeks gestation if severe fetal growth  restriction persists at growth assessment in 3 weeks.  ----------------------------------------------------------------------                 Noralee Space, MD Electronically Signed Final Report   03/26/2023 04:31 pm ----------------------------------------------------------------------   [place] MFM UA CORD DOPPLER  Result Date: 03/26/2023 ----------------------------------------------------------------------  OBSTETRICS REPORT                       (Signed Final 03/26/2023 04:31 pm) ---------------------------------------------------------------------- Patient Info  ID #:       [id number]                          D.O.B.:  08-21-1990 (32 yrs)  Name:       Amy Greene                     Visit Date: 03/26/2023 04:02 pm ---------------------------------------------------------------------- Performed By  Attending:        Noralee Space MD        Ref. Address:     [address]                                                             [place], [place]                                                             [address]  Performed By:     Alain Marion     Location:         [facility]  Referred By:      [facility] ---------------------------------------------------------------------- Orders  #  Description  cm/s)   3.03       75    0.67       78    1.07       82     39.8      No      No ---------------------------------------------------------------------- Impression  Severe fetal growth restriction.  Patient returned for antenatal  testing.  Amniotic fluid is normal and good fetal activity seen.  Cephalic presentation.  Umbilical artery Doppler showed  normal forward diastolic flow.  NST is reactive.  BPP 10/10. ---------------------------------------------------------------------- Recommendations  -Continue weekly BPP, NST and UA Doppler studies till  delivery.  -We will recommend timing of delivery after next fetal growth  assessment.  ----------------------------------------------------------------------                 Noralee Space, MD Electronically Signed Final Report   04/01/2023 02:07 pm ----------------------------------------------------------------------   [place] MFM FETAL BPP WO NON STRESS  Result Date: 03/26/2023 ----------------------------------------------------------------------  OBSTETRICS REPORT                       (Signed Final 03/26/2023 04:31 pm) ---------------------------------------------------------------------- Patient Info  ID #:       [id number]                          D.O.B.:  [birth year] (32 yrs)  Name:       Amy Greene                     Visit Date: 03/26/2023 04:02 pm ---------------------------------------------------------------------- Performed By  Attending:        Noralee Space MD        Ref. Address:     [address]                                                             [place], [place]                                                             [address]  Performed By:     Alain Marion     Location:         [facility]  understands importance of weekly antenatal testing. ---------------------------------------------------------------------- Recommendations  - Weekly BPP, NST and UA Doppler still delivery.  -Delivery at [redacted] weeks gestation if severe fetal growth  restriction persists at growth assessment in 3 weeks.  ----------------------------------------------------------------------                 Noralee Space, MD Electronically Signed Final Report   03/26/2023 04:31 pm ----------------------------------------------------------------------   [place] MFM UA CORD DOPPLER  Result Date: 03/26/2023 ----------------------------------------------------------------------  OBSTETRICS REPORT                       (Signed Final 03/26/2023 04:31 pm) ---------------------------------------------------------------------- Patient Info  ID #:       [id number]                          D.O.B.:  08-21-1990 (32 yrs)  Name:       Amy Greene                     Visit Date: 03/26/2023 04:02 pm ---------------------------------------------------------------------- Performed By  Attending:        Noralee Space MD        Ref. Address:     [address]                                                             [place], [place]                                                             [address]  Performed By:     Alain Marion     Location:         [facility]  Referred By:      [facility] ---------------------------------------------------------------------- Orders  #  Description  abnormalities due to poor  visualization. Various factors such as fetal position,  gestational age and maternal body habitus may increase the  difficulty in visualizing the fetal anatomy. ----------------------------------------------------------------------                  Braxton Feathers, DO Electronically Signed Final Report   04/15/2023 04:16 pm ----------------------------------------------------------------------   [place] MFM UA CORD DOPPLER  Result Date: 04/15/2023 ----------------------------------------------------------------------  OBSTETRICS REPORT                       (Signed Final 04/15/2023 04:16 pm) ---------------------------------------------------------------------- Patient Info  ID #:       [id number]                          D.O.B.:  August 26, 1990 (32 yrs)  Name:       Amy Greene                     Visit Date: 04/15/2023 03:32 pm ---------------------------------------------------------------------- Performed By  Attending:        Braxton Feathers DO       Ref. Address:     [address]                                                             [place], [place]                                                             [address]  Performed By:     Anabel Halon          Location:         [facility]  Referred By:      [facility] ---------------------------------------------------------------------- Orders  #  Description

## 2023-04-22 NOTE — Patient Instructions (Signed)
Return to office for any scheduled appointments. Call the office or go to the MAU at [facility] at [facility] if: You begin to have strong, frequent contractions Your water breaks.  Sometimes it is a big gush of fluid, sometimes it is just a trickle that keeps getting your underwear wet or running down your legs You have vaginal bleeding.  It is normal to have a small amount of spotting if your cervix was checked.  You do not feel your baby moving like normal.  If you do not, get something to eat and drink and lay down and focus on feeling your baby move.   If your baby is still not moving like normal, you should call the office or go to MAU. Any other obstetric concerns.

## 2023-04-23 ENCOUNTER — Ambulatory Visit: Payer: BLUE CROSS/BLUE SHIELD

## 2023-04-23 ENCOUNTER — Other Ambulatory Visit: Payer: Self-pay | Admitting: Obstetrics and Gynecology

## 2023-04-23 ENCOUNTER — Ambulatory Visit: Payer: BLUE CROSS/BLUE SHIELD | Attending: Obstetrics and Gynecology

## 2023-04-23 DIAGNOSIS — O365930 Maternal care for other known or suspected poor fetal growth, third trimester, not applicable or unspecified: Secondary | ICD-10-CM | POA: Insufficient documentation

## 2023-04-23 DIAGNOSIS — O99213 Obesity complicating pregnancy, third trimester: Secondary | ICD-10-CM | POA: Diagnosis present

## 2023-04-23 DIAGNOSIS — Z3A36 36 weeks gestation of pregnancy: Secondary | ICD-10-CM

## 2023-04-23 DIAGNOSIS — O99013 Anemia complicating pregnancy, third trimester: Secondary | ICD-10-CM | POA: Diagnosis not present

## 2023-04-23 DIAGNOSIS — D649 Anemia, unspecified: Secondary | ICD-10-CM | POA: Diagnosis not present

## 2023-04-23 DIAGNOSIS — E669 Obesity, unspecified: Secondary | ICD-10-CM

## 2023-04-24 LAB — CERVICOVAGINAL ANCILLARY ONLY
Bacterial Vaginitis (gardnerella): NEGATIVE
Candida Glabrata: NEGATIVE
Candida Vaginitis: POSITIVE — AB
Chlamydia: NEGATIVE
Comment: NEGATIVE
Comment: NEGATIVE
Comment: NEGATIVE
Comment: NEGATIVE
Comment: NEGATIVE
Comment: NORMAL
Neisseria Gonorrhea: NEGATIVE
Trichomonas: NEGATIVE

## 2023-04-27 DIAGNOSIS — O99820 Streptococcus B carrier state complicating pregnancy: Secondary | ICD-10-CM | POA: Insufficient documentation

## 2023-04-27 LAB — CULTURE, BETA STREP (GROUP B ONLY): Strep Gp B Culture: POSITIVE — AB

## 2023-04-27 MED ORDER — FLUCONAZOLE 150 MG PO TABS
150.0000 mg | ORAL_TABLET | Freq: Once | ORAL | 0 refills | Status: AC
Start: 2023-04-27 — End: 2023-04-27

## 2023-04-27 NOTE — Addendum Note (Signed)
Addended by: Jaynie Collins A on: 04/27/2023 09:01 AM   Modules accepted: Orders

## 2023-04-29 ENCOUNTER — Ambulatory Visit: Payer: BLUE CROSS/BLUE SHIELD | Attending: Obstetrics and Gynecology | Admitting: *Deleted

## 2023-04-29 DIAGNOSIS — Z3A37 37 weeks gestation of pregnancy: Secondary | ICD-10-CM | POA: Diagnosis not present

## 2023-04-29 DIAGNOSIS — O365930 Maternal care for other known or suspected poor fetal growth, third trimester, not applicable or unspecified: Secondary | ICD-10-CM | POA: Diagnosis present

## 2023-04-29 NOTE — Procedures (Signed)
Amy Greene October 29, 1990 [redacted]w[redacted]d  Fetus A Non-Stress Test Interpretation for 04/29/23-NST only  Indication: IUGR  Fetal Heart Rate A Mode: External Baseline Rate (A): 145 bpm Variability: Moderate Accelerations: 15 x 15 Decelerations: Variable Multiple birth?: No  Uterine Activity Mode: Toco Contraction Frequency (min): occas Contraction Duration (sec): 80 Contraction Quality: Mild Resting Tone Palpated: Relaxed  Interpretation (Fetal Testing) Nonstress Test Interpretation: Reactive Comments: tracing reviewed by Dr. Grace Bushy

## 2023-04-30 ENCOUNTER — Other Ambulatory Visit: Payer: Self-pay

## 2023-04-30 ENCOUNTER — Encounter: Payer: Self-pay | Admitting: Obstetrics and Gynecology

## 2023-04-30 ENCOUNTER — Ambulatory Visit: Payer: BLUE CROSS/BLUE SHIELD | Admitting: Obstetrics and Gynecology

## 2023-04-30 VITALS — BP 111/72 | HR 92 | Wt 197.1 lb

## 2023-04-30 DIAGNOSIS — O365930 Maternal care for other known or suspected poor fetal growth, third trimester, not applicable or unspecified: Secondary | ICD-10-CM

## 2023-04-30 DIAGNOSIS — O0993 Supervision of high risk pregnancy, unspecified, third trimester: Secondary | ICD-10-CM

## 2023-04-30 DIAGNOSIS — O99013 Anemia complicating pregnancy, third trimester: Secondary | ICD-10-CM

## 2023-04-30 DIAGNOSIS — B379 Candidiasis, unspecified: Secondary | ICD-10-CM

## 2023-04-30 DIAGNOSIS — Z98891 History of uterine scar from previous surgery: Secondary | ICD-10-CM

## 2023-04-30 DIAGNOSIS — Z3A37 37 weeks gestation of pregnancy: Secondary | ICD-10-CM

## 2023-04-30 DIAGNOSIS — O99820 Streptococcus B carrier state complicating pregnancy: Secondary | ICD-10-CM

## 2023-04-30 MED ORDER — FLUCONAZOLE 150 MG PO TABS
150.0000 mg | ORAL_TABLET | Freq: Once | ORAL | 0 refills | Status: AC
Start: 2023-04-30 — End: 2023-04-30

## 2023-04-30 NOTE — Progress Notes (Signed)
Subjective:  Amy Greene is a 32 y.o. G2P1001 at [redacted]w[redacted]d being seen today for ongoing prenatal care.  She is currently monitored for the following issues for this high-risk pregnancy and has History of emergency cesarean delivery; Supervision of high-risk pregnancy; Umbilical hernia without obstruction and without gangrene; Anemia complicating pregnancy, third trimester; Obesity affecting pregnancy; IUGR (intrauterine growth restriction) affecting care of mother; and Group B Streptococcus carrier, +RV culture, currently pregnant on their problem list.  Patient reports  general discomforts of pregnancy .  Contractions: Irritability. Vag. Bleeding: None.  Movement: Present. Denies leaking of fluid.   The following portions of the patient's history were reviewed and updated as appropriate: allergies, current medications, past family history, past medical history, past social history, past surgical history and problem list. Problem list updated.  Objective:   Vitals:   04/30/23 1610  BP: 111/72  Pulse: 92  Weight: 197 lb 1.6 oz (89.4 kg)    Fetal Status: Fetal Heart Rate (bpm): 140   Movement: Present     General:  Alert, oriented and cooperative. Patient is in no acute distress.  Skin: Skin is warm and dry. No rash noted.   Cardiovascular: Normal heart rate noted  Respiratory: Normal respiratory effort, no problems with respiration noted  Abdomen: Soft, gravid, appropriate for gestational age. Pain/Pressure: Present     Pelvic:  Cervical exam deferred        Extremities: Normal range of motion.  Edema: Trace  Mental Status: Normal mood and affect. Normal behavior. Normal judgment and thought content.   Urinalysis:      Assessment and Plan:  Pregnancy: G2P1001 at [redacted]w[redacted]d  1. Yeast infection  - fluconazole (DIFLUCAN) 150 MG tablet; Take 1 tablet (150 mg total) by mouth once for 1 dose.  Dispense: 1 tablet; Refill: 0  2. Supervision of high risk pregnancy in third trimester Stable Labor  precautions  3. Poor fetal growth affecting management of mother in third trimester, single or unspecified fetus Stable Serial growth scans and weekly antenatal testing as per MFM Growth next week  4. Group B Streptococcus carrier, +RV culture, currently pregnant Tx while in labor  5. History of emergency cesarean delivery Secondary to fetal intolerance of labor Desires TOLAC Consented  6. Anemia complicating pregnancy, third trimester Stable Continue with oral supplement  Term labor symptoms and general obstetric precautions including but not limited to vaginal bleeding, contractions, leaking of fluid and fetal movement were reviewed in detail with the patient. Please refer to After Visit Summary for other counseling recommendations.  Return in about 1 week (around 05/07/2023) for OB visit, face to face, MD only.   Hermina Staggers, MD

## 2023-05-06 ENCOUNTER — Ambulatory Visit: Payer: BLUE CROSS/BLUE SHIELD

## 2023-05-06 ENCOUNTER — Inpatient Hospital Stay (HOSPITAL_COMMUNITY)
Admission: AD | Admit: 2023-05-06 | Discharge: 2023-05-06 | Discharge status: Against Medical Advice | Payer: BLUE CROSS/BLUE SHIELD | Attending: Obstetrics & Gynecology | Admitting: Obstetrics & Gynecology

## 2023-05-06 ENCOUNTER — Encounter (HOSPITAL_COMMUNITY): Payer: Self-pay | Admitting: Obstetrics & Gynecology

## 2023-05-06 ENCOUNTER — Encounter: Payer: Self-pay | Admitting: Maternal & Fetal Medicine

## 2023-05-06 ENCOUNTER — Ambulatory Visit: Payer: BLUE CROSS/BLUE SHIELD | Admitting: *Deleted

## 2023-05-06 DIAGNOSIS — O365930 Maternal care for other known or suspected poor fetal growth, third trimester, not applicable or unspecified: Secondary | ICD-10-CM

## 2023-05-06 DIAGNOSIS — O368390 Maternal care for abnormalities of the fetal heart rate or rhythm, unspecified trimester, not applicable or unspecified: Secondary | ICD-10-CM

## 2023-05-06 DIAGNOSIS — E669 Obesity, unspecified: Secondary | ICD-10-CM

## 2023-05-06 DIAGNOSIS — O99213 Obesity complicating pregnancy, third trimester: Secondary | ICD-10-CM | POA: Insufficient documentation

## 2023-05-06 DIAGNOSIS — Z3A38 38 weeks gestation of pregnancy: Secondary | ICD-10-CM

## 2023-05-06 DIAGNOSIS — O99013 Anemia complicating pregnancy, third trimester: Secondary | ICD-10-CM

## 2023-05-06 DIAGNOSIS — D649 Anemia, unspecified: Secondary | ICD-10-CM

## 2023-05-06 DIAGNOSIS — Z5329 Procedure and treatment not carried out because of patient's decision for other reasons: Secondary | ICD-10-CM | POA: Diagnosis not present

## 2023-05-06 NOTE — MAU Provider Note (Signed)
History     CSN: [id number]  Arrival date and time: 05/06/23 1714   Event Date/Time   First Provider Initiated Contact with Patient 05/06/23 1755      Chief Complaint  Patient presents with   Additional monitoring for failed NST   [facility] , a  32 y.o. G2P1001 at [redacted]w[redacted]d presents to MAU without complaints. She was sent over from MFM for prolonged monitoring after having reported decels during [place] and NST today. Patient reports that she is truly unaware as to why she needed to come "She just did." She denies vaginal bleeding, leaking of fluid and contractions. She endorses positive fetal movement.          OB History     Gravida  2   Para  1   Term  1   Preterm  0   AB  0   Living  1      SAB  0   IAB  0   Ectopic  0   Multiple  0   Live Births  1           Past Medical History:  Diagnosis Date   Anemia    Gestational diabetes 04/03/2023    Past Surgical History:  Procedure Laterality Date   CESAREAN SECTION      Family History  Problem Relation Age of Onset   Diabetes Neg Hx    Hypertension Neg Hx    Asthma Neg Hx    Cancer Neg Hx    Heart disease Neg Hx     Social History   Tobacco Use   Smoking status: Never  Vaping Use   Vaping status: Never Used  Substance Use Topics   Alcohol use: No    Comment: social   Drug use: Not Currently    Allergies: No Known Allergies  Medications Prior to Admission  Medication Sig Dispense Refill Last Dose   ferrous sulfate 325 (65 FE) MG EC tablet Take 1 tablet (325 mg total) by mouth every other day. 30 tablet 2 05/06/2023   polyethylene glycol powder (GLYCOLAX/MIRALAX) 17 GM/SCOOP powder Take 17 g by mouth daily as needed. 510 g 5 Past Week   Prenatal Vit-Fe Fumarate-FA (MULTIVITAMIN-PRENATAL) 27-0.8 MG TABS tablet Take 1 tablet by mouth daily at 12 noon.   05/06/2023    Review of Systems  Constitutional:  Negative for chills, fatigue and fever.  Eyes:  Negative for pain and visual  disturbance.  Respiratory:  Negative for apnea, shortness of breath and wheezing.   Cardiovascular:  Negative for chest pain and palpitations.  Gastrointestinal:  Negative for abdominal pain, constipation, diarrhea, nausea and vomiting.  Genitourinary:  Negative for difficulty urinating, dysuria, pelvic pain, vaginal bleeding, vaginal discharge and vaginal pain.  Musculoskeletal:  Negative for back pain.  Neurological:  Negative for seizures, weakness and headaches.  Psychiatric/Behavioral:  Negative for suicidal ideas.    Physical Exam   Blood pressure 113/74, pulse 74, temperature 98.3 F (36.8 C), temperature source Oral, resp. rate 16, height 5\' 5"  (1.651 m), weight 88.9 kg, last menstrual period 07/23/2022, SpO2 100%.  Physical Exam Vitals and nursing note reviewed.  Constitutional:      General: She is not in acute distress.    Appearance: Normal appearance.  HENT:     Head: Normocephalic.  Pulmonary:     Effort: Pulmonary effort is normal.  Musculoskeletal:     Cervical back: Normal range of motion.  Skin:    General: Skin is  warm and dry.  Neurological:     Mental Status: She is alert and oriented to person, place, and time.  Psychiatric:        Mood and Affect: Mood normal.    FHT: 125 bpm with moderate variability, 15x15 accels present no decels noted. (Appropriate for gestational age)  Toco: irregular contractions (patient unaware)  MAU Course  Procedures  MDM - NST reactive - Recommendation for 24hr obs with prolonged monitoring. Reviewed plan of care with patient and patient has no desire to stay overnight. Reviewed patient desires with Dr. Vergie Living.  - MD offered prolonged monitoring for 6 hours and if R&R possible plan for discharge. Patient reports that she has a 34 year old that she needs to get home to, and does not desire to stay.  - Patient desires to sign out AMA and reports that she will come back tomorrow if need be.   - Reviewed worsening sings and  return precautions.   Assessment and Plan  - Patient singed out AMA  - FHT appropriate for gestational age at time AMA.  - Message sent to Dr. Alysia Penna discussing a plan of care for tomorrow OB visit.   Claudette Head, MSN CNM  05/06/2023, 5:55 PM

## 2023-05-06 NOTE — MAU Note (Signed)
Patient opted to leave AMA after speaking extensively to CNM and RN. Patient verbalized understanding and signed AMA form.

## 2023-05-06 NOTE — Procedures (Signed)
Gracelin Margulies July 11, 1991 [redacted]w[redacted]d  Fetus A Non-Stress Test Interpretation for 05/06/23  BPP with NST  Indication:  FHR decels on US  Fetal Heart Rate A Mode: External Baseline Rate (A):  (Indeterminate) Variability: Moderate Accelerations:  (unable to determine) Decelerations:  (questionable variable decels) Multiple birth?: No  Uterine Activity Mode: Palpation, Toco Contraction Frequency (min): 1 uc Contraction Duration (sec): 150 Contraction Quality: Mild Resting Tone Palpated: Relaxed Resting Time: Adequate  Interpretation (Fetal Testing) Nonstress Test Interpretation: Non-reactive Overall Impression: Non-reassuring Comments: Dr, Grace Bushy reviewed tracing. Patient sent to [facility] MAU for extended monitoring.

## 2023-05-06 NOTE — MAU Note (Signed)
.  Amy Greene is a 32 y.o. at [redacted]w[redacted]d here in MAU reporting: sent over from office for failed NST; chart reports FHR decels. Denies any VB or LOF. Reports some mild pelvic pressure that has been on-going, but no new pain. + FM.   Hx of C/S, desires TOLAC.  Onset of complaint: Today Pain score: "mild" Vitals:   05/06/23 1738  BP: 111/71  Pulse: 77  Resp: 16  Temp: 98.3 F (36.8 C)  SpO2: 100%     FHT:150 Lab orders placed from triage:  N/A

## 2023-05-07 ENCOUNTER — Other Ambulatory Visit: Payer: Self-pay

## 2023-05-07 ENCOUNTER — Ambulatory Visit: Payer: BLUE CROSS/BLUE SHIELD | Admitting: Obstetrics and Gynecology

## 2023-05-07 VITALS — BP 117/58 | HR 84 | Wt 199.7 lb

## 2023-05-07 DIAGNOSIS — Z3A38 38 weeks gestation of pregnancy: Secondary | ICD-10-CM

## 2023-05-07 DIAGNOSIS — O99820 Streptococcus B carrier state complicating pregnancy: Secondary | ICD-10-CM

## 2023-05-07 DIAGNOSIS — O365930 Maternal care for other known or suspected poor fetal growth, third trimester, not applicable or unspecified: Secondary | ICD-10-CM

## 2023-05-07 DIAGNOSIS — Z98891 History of uterine scar from previous surgery: Secondary | ICD-10-CM

## 2023-05-07 DIAGNOSIS — O0993 Supervision of high risk pregnancy, unspecified, third trimester: Secondary | ICD-10-CM

## 2023-05-07 NOTE — Progress Notes (Signed)
Subjective:  Amy Greene is a 32 y.o. G2P1001 at [redacted]w[redacted]d being seen today for ongoing prenatal care.  She is currently monitored for the following issues for this high-risk pregnancy and has History of emergency cesarean delivery; Supervision of high-risk pregnancy; Umbilical hernia without obstruction and without gangrene; Anemia complicating pregnancy, third trimester; Obesity affecting pregnancy; IUGR (intrauterine growth restriction) affecting care of mother; and Group B Streptococcus carrier, +RV culture, currently pregnant on their problem list.  Patient reports  general discomforts of pregnancy .  Contractions: Irritability. Vag. Bleeding: None.  Movement: Present. Denies leaking of fluid.   The following portions of the patient's history were reviewed and updated as appropriate: allergies, current medications, past family history, past medical history, past social history, past surgical history and problem list. Problem list updated.  Objective:   Vitals:   05/07/23 1622  BP: (!) 117/58  Pulse: 84  Weight: 199 lb 11.2 oz (90.6 kg)    Fetal Status: Fetal Heart Rate (bpm): 138   Movement: Present     General:  Alert, oriented and cooperative. Patient is in no acute distress.  Skin: Skin is warm and dry. No rash noted.   Cardiovascular: Normal heart rate noted  Respiratory: Normal respiratory effort, no problems with respiration noted  Abdomen: Soft, gravid, appropriate for gestational age. Pain/Pressure: Present     Pelvic:  Cervical exam performed        Extremities: Normal range of motion.  Edema: None  Mental Status: Normal mood and affect. Normal behavior. Normal judgment and thought content.   Urinalysis:      Assessment and Plan:  Pregnancy: G2P1001 at [redacted]w[redacted]d  1. Supervision of high risk pregnancy in third trimester Stable - [place] MFM FETAL BPP WO NON STRESS; Future  2. History of emergency cesarean delivery Desires TOLAC Consented  3. Poor fetal growth affecting  management of mother in third trimester, single or unspecified fetus Resolved on U/S yesterday Fetal decelerations note while with MFM Monitored in MAU with reactive NST, unable to stay for prolonged monitoring due to childcare Pt reports good FM today BPP for next week - [place] MFM FETAL BPP WO NON STRESS; Future  4. Group B Streptococcus carrier, +RV culture, currently pregnant Tx while in labor  Term labor symptoms and general obstetric precautions including but not limited to vaginal bleeding, contractions, leaking of fluid and fetal movement were reviewed in detail with the patient. Please refer to After Visit Summary for other counseling recommendations.  Return in about 1 week (around 05/14/2023) for OB visit, face to face, MD only.   Hermina Staggers, MD

## 2023-05-13 ENCOUNTER — Ambulatory Visit (INDEPENDENT_AMBULATORY_CARE_PROVIDER_SITE_OTHER): Payer: BLUE CROSS/BLUE SHIELD | Admitting: Family Medicine

## 2023-05-13 ENCOUNTER — Ambulatory Visit (HOSPITAL_BASED_OUTPATIENT_CLINIC_OR_DEPARTMENT_OTHER): Payer: BLUE CROSS/BLUE SHIELD | Admitting: *Deleted

## 2023-05-13 ENCOUNTER — Other Ambulatory Visit: Payer: Self-pay | Admitting: General Practice

## 2023-05-13 ENCOUNTER — Other Ambulatory Visit: Payer: Self-pay

## 2023-05-13 ENCOUNTER — Ambulatory Visit: Payer: BLUE CROSS/BLUE SHIELD

## 2023-05-13 VITALS — BP 109/65 | HR 102 | Wt 200.5 lb

## 2023-05-13 DIAGNOSIS — O365990 Maternal care for other known or suspected poor fetal growth, unspecified trimester, not applicable or unspecified: Secondary | ICD-10-CM | POA: Insufficient documentation

## 2023-05-13 DIAGNOSIS — O365930 Maternal care for other known or suspected poor fetal growth, third trimester, not applicable or unspecified: Secondary | ICD-10-CM | POA: Diagnosis not present

## 2023-05-13 DIAGNOSIS — O99820 Streptococcus B carrier state complicating pregnancy: Secondary | ICD-10-CM

## 2023-05-13 DIAGNOSIS — O99013 Anemia complicating pregnancy, third trimester: Secondary | ICD-10-CM | POA: Diagnosis not present

## 2023-05-13 DIAGNOSIS — Z3A39 39 weeks gestation of pregnancy: Secondary | ICD-10-CM

## 2023-05-13 DIAGNOSIS — O99210 Obesity complicating pregnancy, unspecified trimester: Secondary | ICD-10-CM

## 2023-05-13 DIAGNOSIS — O0993 Supervision of high risk pregnancy, unspecified, third trimester: Secondary | ICD-10-CM | POA: Diagnosis not present

## 2023-05-13 DIAGNOSIS — Z98891 History of uterine scar from previous surgery: Secondary | ICD-10-CM

## 2023-05-13 LAB — POCT HEMOGLOBIN-HEMACUE: Hemoglobin: 11.1 g/dL — ABNORMAL LOW (ref 12.0–15.0)

## 2023-05-13 NOTE — Procedures (Signed)
Amy Greene 1991-03-19 [redacted]w[redacted]d  Fetus A Non-Stress Test Interpretation for 05/13/23  Indication: IUGR  Fetal Heart Rate A Mode: External Baseline Rate (A): 135 bpm Variability: Moderate Accelerations: 15 x 15 Decelerations: None Multiple birth?: No  Uterine Activity Mode: Palpation, Toco Contraction Frequency (min): 1 uc Contraction Duration (sec): 120 Contraction Quality: Mild Resting Tone Palpated: Relaxed Resting Time: Adequate  Interpretation (Fetal Testing) Nonstress Test Interpretation: Reactive Comments: Dr. Judeth Cornfield reviewed tracing

## 2023-05-13 NOTE — Progress Notes (Signed)
   PRENATAL VISIT NOTE  Subjective:  Amy Greene is a 32 y.o. G2P1001 at [redacted]w[redacted]d being seen today for ongoing prenatal care.  She is currently monitored for the following issues for this high-risk pregnancy and has History of emergency cesarean delivery; Supervision of high-risk pregnancy; Umbilical hernia without obstruction and without gangrene; Anemia complicating pregnancy, third trimester; Obesity affecting pregnancy; IUGR Resolved; and Group B Streptococcus carrier, +RV culture, currently pregnant on their problem list.  Patient reports no complaints.  Contractions: Not present. Vag. Bleeding: None.  Movement: Present. Denies leaking of fluid.   The following portions of the patient's history were reviewed and updated as appropriate: allergies, current medications, past family history, past medical history, past social history, past surgical history and problem list.   Objective:   Vitals:   05/13/23 1321  BP: 109/65  Pulse: (!) 102  Weight: 200 lb 8 oz (90.9 kg)    Fetal Status: Fetal Heart Rate (bpm): 147 Fundal Height: 39 cm Movement: Present  Presentation: Vertex  General:  Alert, oriented and cooperative. Patient is in no acute distress.  Skin: Skin is warm and dry. No rash noted.   Cardiovascular: Normal heart rate noted  Respiratory: Normal respiratory effort, no problems with respiration noted  Abdomen: Soft, gravid, appropriate for gestational age.  Pain/Pressure: Present     Pelvic: Cervical exam performed in the presence of a chaperone Dilation: 1.5 Effacement (%): 50 Station: -3  Extremities: Normal range of motion.  Edema: Trace (feet)  Mental Status: Normal mood and affect. Normal behavior. Normal judgment and thought content.   Assessment and Plan:  Pregnancy: G2P1001 at [redacted]w[redacted]d 1. Poor fetal growth affecting management of mother in third trimester, single or unspecified fetus RESOLVED Has [place] today with BPP and also 10/16  2. Anemia complicating pregnancy, third  trimester Lab Results  Component Value Date   HGB 9.6 (L) 04/01/2023   HGB 9.3 (L) 02/17/2023   HGB 10.7 11/18/2022  - POC HGB today- 11.1   3. Group B Streptococcus carrier, +RV culture, currently pregnant PCN in labor  4. History of emergency cesarean delivery Desires TOLAC, Signed consent  5. Obesity affecting pregnancy, antepartum, unspecified obesity type TWG=10 lb 8 oz (4.763 kg) which is within goal  6. Supervision of high risk pregnancy in third trimester Vigorous movement FH appropriate Desires to go into spontaneous labor Has appropriate antenatal testing  Discussed membrane sweeping today. Reviewed cochrane review data on membrane sweeping at 39 wks and then at EDD. Reviewed risk of cramping, contractions, bleeding and ROM. Answered patient questions and she agreed to proceed with procedure. Tolerated well.  IOL scheduled fro 10/22 Midnight (41 weeks) per patient request. Discussed IOL at EDD and within 40th week and patient prefers a 41 wks IOL. I am unable to find updated information regarding IOL timing since FGR is resolved.    Preterm labor symptoms and general obstetric precautions including but not limited to vaginal bleeding, contractions, leaking of fluid and fetal movement were reviewed in detail with the patient. Please refer to After Visit Summary for other counseling recommendations.   Return in about 1 week (around 05/20/2023) for Routine prenatal care.  Future Appointments  Date Time Provider Department Center  05/13/2023  3:15 PM [facility] NST [facility]  05/13/2023  4:15 PM [facility] US2 [facility]  05/20/2023  2:15 PM [facility] NURSE [facility]  05/20/2023  2:30 PM [facility] US3 [facility]    Federico Flake, MD

## 2023-05-14 ENCOUNTER — Inpatient Hospital Stay (HOSPITAL_COMMUNITY)
Admission: AD | Admit: 2023-05-14 | Discharge: 2023-05-17 | DRG: 807 | Disposition: A | Discharge status: Home / Self Care | Payer: BLUE CROSS/BLUE SHIELD | Attending: Obstetrics & Gynecology | Admitting: Obstetrics & Gynecology

## 2023-05-14 DIAGNOSIS — O9902 Anemia complicating childbirth: Secondary | ICD-10-CM | POA: Diagnosis present

## 2023-05-14 DIAGNOSIS — O34219 Maternal care for unspecified type scar from previous cesarean delivery: Secondary | ICD-10-CM | POA: Diagnosis present

## 2023-05-14 DIAGNOSIS — O99820 Streptococcus B carrier state complicating pregnancy: Principal | ICD-10-CM

## 2023-05-14 DIAGNOSIS — O0990 Supervision of high risk pregnancy, unspecified, unspecified trimester: Secondary | ICD-10-CM

## 2023-05-14 DIAGNOSIS — O480 Post-term pregnancy: Principal | ICD-10-CM | POA: Diagnosis present

## 2023-05-14 DIAGNOSIS — O99214 Obesity complicating childbirth: Secondary | ICD-10-CM | POA: Diagnosis present

## 2023-05-14 DIAGNOSIS — O9962 Diseases of the digestive system complicating childbirth: Secondary | ICD-10-CM | POA: Diagnosis present

## 2023-05-14 DIAGNOSIS — Z23 Encounter for immunization: Secondary | ICD-10-CM

## 2023-05-14 DIAGNOSIS — O99013 Anemia complicating pregnancy, third trimester: Secondary | ICD-10-CM | POA: Diagnosis present

## 2023-05-14 DIAGNOSIS — K429 Umbilical hernia without obstruction or gangrene: Secondary | ICD-10-CM | POA: Diagnosis present

## 2023-05-14 DIAGNOSIS — O99210 Obesity complicating pregnancy, unspecified trimester: Secondary | ICD-10-CM | POA: Diagnosis present

## 2023-05-14 DIAGNOSIS — Z3A39 39 weeks gestation of pregnancy: Secondary | ICD-10-CM

## 2023-05-14 DIAGNOSIS — Z98891 History of uterine scar from previous surgery: Secondary | ICD-10-CM

## 2023-05-14 DIAGNOSIS — O365990 Maternal care for other known or suspected poor fetal growth, unspecified trimester, not applicable or unspecified: Secondary | ICD-10-CM | POA: Diagnosis present

## 2023-05-14 DIAGNOSIS — O99824 Streptococcus B carrier state complicating childbirth: Secondary | ICD-10-CM | POA: Diagnosis present

## 2023-05-14 DIAGNOSIS — O0993 Supervision of high risk pregnancy, unspecified, third trimester: Secondary | ICD-10-CM

## 2023-05-14 NOTE — MAU Note (Signed)
.  Amy Greene is a 32 y.o. at [redacted]w[redacted]d here in MAU reporting ctxs since Weds night. Ctxs have become closer since 2045 tonight. Denies LOF or VB and reports good FM. Desires TOLAC  Onset of complaint: 2045 Pain score: 10 Vitals:   05/14/23 2330  Pulse: 78  Resp: 17  Temp: (!) 97.4 F (36.3 C)  SpO2: 99%     FHT:156 Lab orders placed from triage: labor eval

## 2023-05-15 ENCOUNTER — Inpatient Hospital Stay (HOSPITAL_COMMUNITY): Payer: BLUE CROSS/BLUE SHIELD | Admitting: Anesthesiology

## 2023-05-15 ENCOUNTER — Encounter (HOSPITAL_COMMUNITY): Payer: Self-pay | Admitting: Obstetrics & Gynecology

## 2023-05-15 ENCOUNTER — Other Ambulatory Visit: Payer: Self-pay

## 2023-05-15 DIAGNOSIS — Z23 Encounter for immunization: Secondary | ICD-10-CM | POA: Diagnosis not present

## 2023-05-15 DIAGNOSIS — O99824 Streptococcus B carrier state complicating childbirth: Secondary | ICD-10-CM | POA: Diagnosis present

## 2023-05-15 DIAGNOSIS — O99820 Streptococcus B carrier state complicating pregnancy: Secondary | ICD-10-CM | POA: Diagnosis not present

## 2023-05-15 DIAGNOSIS — O26893 Other specified pregnancy related conditions, third trimester: Secondary | ICD-10-CM | POA: Diagnosis present

## 2023-05-15 DIAGNOSIS — Z3A39 39 weeks gestation of pregnancy: Secondary | ICD-10-CM

## 2023-05-15 DIAGNOSIS — O99214 Obesity complicating childbirth: Secondary | ICD-10-CM | POA: Diagnosis present

## 2023-05-15 DIAGNOSIS — O34219 Maternal care for unspecified type scar from previous cesarean delivery: Secondary | ICD-10-CM | POA: Insufficient documentation

## 2023-05-15 DIAGNOSIS — O9902 Anemia complicating childbirth: Secondary | ICD-10-CM | POA: Diagnosis present

## 2023-05-15 DIAGNOSIS — K429 Umbilical hernia without obstruction or gangrene: Secondary | ICD-10-CM | POA: Diagnosis present

## 2023-05-15 DIAGNOSIS — O34211 Maternal care for low transverse scar from previous cesarean delivery: Secondary | ICD-10-CM | POA: Diagnosis not present

## 2023-05-15 DIAGNOSIS — O480 Post-term pregnancy: Secondary | ICD-10-CM | POA: Diagnosis present

## 2023-05-15 DIAGNOSIS — O9962 Diseases of the digestive system complicating childbirth: Secondary | ICD-10-CM | POA: Diagnosis present

## 2023-05-15 LAB — TYPE AND SCREEN
ABO/RH(D): O POS
Antibody Screen: NEGATIVE

## 2023-05-15 LAB — CBC
HCT: 35.2 % — ABNORMAL LOW (ref 36.0–46.0)
Hemoglobin: 11.5 g/dL — ABNORMAL LOW (ref 12.0–15.0)
MCH: 29.3 pg (ref 26.0–34.0)
MCHC: 32.7 g/dL (ref 30.0–36.0)
MCV: 89.6 fL (ref 80.0–100.0)
Platelets: 214 10*3/uL (ref 150–400)
RBC: 3.93 MIL/uL (ref 3.87–5.11)
RDW: 13.2 % (ref 11.5–15.5)
WBC: 8.6 10*3/uL (ref 4.0–10.5)
nRBC: 0 % (ref 0.0–0.2)

## 2023-05-15 LAB — RPR: RPR Ser Ql: NONREACTIVE

## 2023-05-15 MED ORDER — ONDANSETRON HCL 4 MG/2ML IJ SOLN: 4.0000 mg | Freq: Four times a day (QID) | INTRAMUSCULAR | Status: DC | PRN

## 2023-05-15 MED ORDER — DIPHENHYDRAMINE HCL 25 MG PO CAPS: 25.0000 mg | ORAL_CAPSULE | Freq: Four times a day (QID) | ORAL | Status: DC | PRN

## 2023-05-15 MED ORDER — LACTATED RINGERS IV SOLN: 500.0000 mL | Freq: Once | INTRAVENOUS | Status: DC

## 2023-05-15 MED ORDER — BISACODYL 10 MG RE SUPP
10.0000 mg | Freq: Every day | RECTAL | Status: DC | PRN
  Administered 2023-05-16: 10 mg via RECTAL
  Filled 2023-05-15: qty 1

## 2023-05-15 MED ORDER — ACETAMINOPHEN 325 MG PO TABS: 650.0000 mg | ORAL_TABLET | ORAL | Status: DC | PRN

## 2023-05-15 MED ORDER — EPHEDRINE 5 MG/ML INJ: 10.0000 mg | INTRAVENOUS | Status: DC | PRN

## 2023-05-15 MED ORDER — MEDROXYPROGESTERONE ACETATE 150 MG/ML IM SUSP: 150.0000 mg | INTRAMUSCULAR | Status: DC | PRN

## 2023-05-15 MED ORDER — LACTATED RINGERS IV SOLN: INTRAVENOUS | Status: DC

## 2023-05-15 MED ORDER — COCONUT OIL OIL
1.0000 | TOPICAL_OIL | Status: DC | PRN
  Administered 2023-05-17: 1 via TOPICAL

## 2023-05-15 MED ORDER — ONDANSETRON HCL 4 MG/2ML IJ SOLN: 4.0000 mg | INTRAMUSCULAR | Status: DC | PRN

## 2023-05-15 MED ORDER — SENNOSIDES-DOCUSATE SODIUM 8.6-50 MG PO TABS
2.0000 | ORAL_TABLET | ORAL | Status: DC
  Administered 2023-05-15 – 2023-05-17 (×3): 2 via ORAL
  Filled 2023-05-15 (×3): qty 2

## 2023-05-15 MED ORDER — OXYTOCIN BOLUS FROM INFUSION
333.0000 mL | Freq: Once | INTRAVENOUS | Status: AC
  Administered 2023-05-15: 333 mL via INTRAVENOUS

## 2023-05-15 MED ORDER — SIMETHICONE 80 MG PO CHEW: 80.0000 mg | CHEWABLE_TABLET | ORAL | Status: DC | PRN

## 2023-05-15 MED ORDER — TETANUS-DIPHTH-ACELL PERTUSSIS 5-2.5-18.5 LF-MCG/0.5 IM SUSY: 0.5000 mL | PREFILLED_SYRINGE | Freq: Once | INTRAMUSCULAR | Status: DC

## 2023-05-15 MED ORDER — LIDOCAINE HCL (PF) 1 % IJ SOLN: 30.0000 mL | INTRAMUSCULAR | Status: DC | PRN

## 2023-05-15 MED ORDER — OXYCODONE-ACETAMINOPHEN 5-325 MG PO TABS: 2.0000 | ORAL_TABLET | ORAL | Status: DC | PRN

## 2023-05-15 MED ORDER — ACETAMINOPHEN 325 MG PO TABS
650.0000 mg | ORAL_TABLET | ORAL | Status: DC | PRN
  Administered 2023-05-15 – 2023-05-17 (×3): 650 mg via ORAL
  Filled 2023-05-15 (×3): qty 2

## 2023-05-15 MED ORDER — FENTANYL CITRATE (PF) 100 MCG/2ML IJ SOLN
50.0000 ug | INTRAMUSCULAR | Status: DC | PRN
  Administered 2023-05-15: 100 ug via INTRAVENOUS
  Filled 2023-05-15: qty 2

## 2023-05-15 MED ORDER — SOD CITRATE-CITRIC ACID 500-334 MG/5ML PO SOLN: 30.0000 mL | ORAL | Status: DC | PRN

## 2023-05-15 MED ORDER — DIPHENHYDRAMINE HCL 50 MG/ML IJ SOLN: 12.5000 mg | INTRAMUSCULAR | Status: DC | PRN

## 2023-05-15 MED ORDER — SODIUM CHLORIDE 0.9 % IV SOLN
5.0000 10*6.[IU] | Freq: Once | INTRAVENOUS | Status: DC
  Filled 2023-05-15: qty 5

## 2023-05-15 MED ORDER — PRENATAL MULTIVITAMIN CH
1.0000 | ORAL_TABLET | Freq: Every day | ORAL | Status: DC
  Administered 2023-05-15 – 2023-05-17 (×3): 1 via ORAL
  Filled 2023-05-15 (×3): qty 1

## 2023-05-15 MED ORDER — WITCH HAZEL-GLYCERIN EX PADS: 1.0000 | MEDICATED_PAD | CUTANEOUS | Status: DC | PRN

## 2023-05-15 MED ORDER — METHYLERGONOVINE MALEATE 0.2 MG/ML IJ SOLN: 0.2000 mg | INTRAMUSCULAR | Status: DC | PRN

## 2023-05-15 MED ORDER — PHENYLEPHRINE 80 MCG/ML (10ML) SYRINGE FOR IV PUSH (FOR BLOOD PRESSURE SUPPORT)
80.0000 ug | PREFILLED_SYRINGE | INTRAVENOUS | Status: DC | PRN
  Administered 2023-05-15: 80 ug via INTRAVENOUS

## 2023-05-15 MED ORDER — SODIUM CHLORIDE 0.9 % IV SOLN
2.0000 g | Freq: Four times a day (QID) | INTRAVENOUS | Status: DC
  Administered 2023-05-15 (×2): 2 g via INTRAVENOUS
  Filled 2023-05-15 (×2): qty 2000

## 2023-05-15 MED ORDER — BUPIVACAINE HCL (PF) 0.25 % IJ SOLN
INTRAMUSCULAR | Status: DC | PRN
  Administered 2023-05-15: 1.6 mL via INTRATHECAL

## 2023-05-15 MED ORDER — METHYLERGONOVINE MALEATE 0.2 MG PO TABS: 0.2000 mg | ORAL_TABLET | ORAL | Status: DC | PRN

## 2023-05-15 MED ORDER — OXYTOCIN-SODIUM CHLORIDE 30-0.9 UT/500ML-% IV SOLN
2.5000 [IU]/h | INTRAVENOUS | Status: DC
  Administered 2023-05-15: 2.5 [IU]/h via INTRAVENOUS
  Filled 2023-05-15: qty 500

## 2023-05-15 MED ORDER — FENTANYL-BUPIVACAINE-NACL 0.5-0.125-0.9 MG/250ML-% EP SOLN
12.0000 mL/h | EPIDURAL | Status: DC | PRN
  Administered 2023-05-15: 12 mL/h via EPIDURAL
  Filled 2023-05-15: qty 250

## 2023-05-15 MED ORDER — DIBUCAINE (PERIANAL) 1 % EX OINT: 1.0000 | TOPICAL_OINTMENT | CUTANEOUS | Status: DC | PRN

## 2023-05-15 MED ORDER — PENICILLIN G POT IN DEXTROSE 60000 UNIT/ML IV SOLN: 3.0000 10*6.[IU] | INTRAVENOUS | Status: DC

## 2023-05-15 MED ORDER — OXYCODONE-ACETAMINOPHEN 5-325 MG PO TABS: 1.0000 | ORAL_TABLET | ORAL | Status: DC | PRN

## 2023-05-15 MED ORDER — PHENYLEPHRINE 80 MCG/ML (10ML) SYRINGE FOR IV PUSH (FOR BLOOD PRESSURE SUPPORT)
80.0000 ug | PREFILLED_SYRINGE | INTRAVENOUS | Status: DC | PRN
  Filled 2023-05-15: qty 10

## 2023-05-15 MED ORDER — FERROUS SULFATE 325 (65 FE) MG PO TABS
325.0000 mg | ORAL_TABLET | ORAL | Status: DC
  Administered 2023-05-15 – 2023-05-17 (×2): 325 mg via ORAL
  Filled 2023-05-15 (×2): qty 1

## 2023-05-15 MED ORDER — OXYTOCIN-SODIUM CHLORIDE 30-0.9 UT/500ML-% IV SOLN
1.0000 m[IU]/min | INTRAVENOUS | Status: DC
  Administered 2023-05-15: 2 m[IU]/min via INTRAVENOUS

## 2023-05-15 MED ORDER — IBUPROFEN 600 MG PO TABS
600.0000 mg | ORAL_TABLET | Freq: Four times a day (QID) | ORAL | Status: DC
  Administered 2023-05-15 – 2023-05-17 (×9): 600 mg via ORAL
  Filled 2023-05-15 (×9): qty 1

## 2023-05-15 MED ORDER — MEASLES, MUMPS & RUBELLA VAC IJ SOLR
0.5000 mL | Freq: Once | INTRAMUSCULAR | Status: AC
  Administered 2023-05-17: 0.5 mL via SUBCUTANEOUS
  Filled 2023-05-15: qty 0.5

## 2023-05-15 MED ORDER — BENZOCAINE-MENTHOL 20-0.5 % EX AERO
1.0000 | INHALATION_SPRAY | CUTANEOUS | Status: DC | PRN
  Administered 2023-05-15 – 2023-05-17 (×2): 1 via TOPICAL
  Filled 2023-05-15 (×2): qty 56

## 2023-05-15 MED ORDER — TERBUTALINE SULFATE 1 MG/ML IJ SOLN: 0.2500 mg | Freq: Once | INTRAMUSCULAR | Status: DC | PRN

## 2023-05-15 MED ORDER — ONDANSETRON HCL 4 MG PO TABS: 4.0000 mg | ORAL_TABLET | ORAL | Status: DC | PRN

## 2023-05-15 MED ORDER — LACTATED RINGERS IV SOLN
500.0000 mL | INTRAVENOUS | Status: DC | PRN
  Administered 2023-05-15: 500 mL via INTRAVENOUS

## 2023-05-15 MED ORDER — FLEET ENEMA RE ENEM: 1.0000 | ENEMA | Freq: Every day | RECTAL | Status: DC | PRN

## 2023-05-15 NOTE — Anesthesia Procedure Notes (Signed)
Epidural Patient location during procedure: OB Start time: 05/15/2023 1:05 AM End time: 05/15/2023 1:15 AM  Staffing Anesthesiologist: Elmer Picker, MD Performed: anesthesiologist   Preanesthetic Checklist Completed: patient identified, IV checked, risks and benefits discussed, monitors and equipment checked, pre-op evaluation and timeout performed  Epidural Patient position: sitting Prep: DuraPrep and site prepped and draped Patient monitoring: continuous pulse ox, blood pressure, heart rate and cardiac monitor Approach: midline Location: L3-L4 Injection technique: LOR air  Needle:  Needle type: Tuohy  Needle gauge: 17 G Needle length: 9 cm Needle insertion depth: 5 cm Catheter type: closed end flexible Catheter size: 19 Gauge Catheter at skin depth: 10 cm Test dose: negative  Assessment Sensory level: T8 Events: blood not aspirated, no cerebrospinal fluid, injection not painful, no injection resistance, no paresthesia and negative IV test  Additional Notes Patient identified. Risks/Benefits/Options discussed with patient including but not limited to bleeding, infection, nerve damage, paralysis, failed block, incomplete pain control, headache, blood pressure changes, nausea, vomiting, reactions to medication both or allergic, itching and postpartum back pain. Confirmed with bedside nurse the patient's most recent platelet count. Confirmed with patient that they are not currently taking any anticoagulation, have any bleeding history or any family history of bleeding disorders. Patient expressed understanding and wished to proceed. All questions were answered. Sterile technique was used throughout the entire procedure. Please see nursing notes for vital signs. Test dose was given through epidural catheter and negative prior to continuing to dose epidural or start infusion. Warning signs of high block given to the patient including shortness of breath, tingling/numbness in  hands, complete motor block, or any concerning symptoms with instructions to call for help. Patient was given instructions on fall risk and not to get out of bed. All questions and concerns addressed with instructions to call with any issues or inadequate analgesia.    CSE performed 2/2 quickly progressing labor, cervical exam 8cm. LOR at 5cm. 25G pencan inserted into intrathecal space with return of CSF. 0.25% bupi administered intrathecally as per chart. Epidural catheter inserted and left at 10cm at skin. Pt tolerated well. No complications. VSS. Reason for block:procedure for pain

## 2023-05-15 NOTE — Lactation Note (Signed)
This note was copied from a baby's chart. Lactation Consultation Note  Patient Name: Amy Greene [id number] Date: 05/15/2023 Age:34 hours Reason for consult: Initial assessment;Term  Visited P2 parent of term infant for initial consult. Mom has a DEBP at home and also breastfed her 34 year old for 2 years. RN was in the room getting baby warm blankets and encouraging skin to skin as infant's temp was low. RN got infant latched prior to [facility] entry but infant fell asleep. LC reviewed breastfeeding basics, latching techniques, and outpatient services because mom just moved back to [place] from [facility] recently. Upon breast assessment, LC noted easily expressed colostrum.   Feeding Plan 1) Breastfeed infant skin to skin every 2-3 hours or sooner if infant cues. 2) Call RN/LC for breastfeeding assistance.   Maternal Data Has patient been taught Hand Expression?: Yes  Feeding Mother's Current Feeding Choice: Breast Milk   Interventions Interventions: Breast feeding basics reviewed;Education;LC Services brochure  Discharge Pump: DEBP;Personal;Manual;Hands Free WIC Program: Yes  Consult Status Consult Status: Follow-up Date: 05/16/23 Follow-up type: In-patient    Antionette Char 05/15/2023, 1:36 PM

## 2023-05-15 NOTE — Progress Notes (Signed)
Patient Vitals for the past 4 hrs:  BP Temp Temp src Pulse Resp SpO2  05/15/23 0530 110/73 -- -- 82 -- --  05/15/23 0515 120/77 -- -- 83 -- --  05/15/23 0500 107/67 -- -- 65 -- --  05/15/23 0445 110/67 -- -- 69 -- --  05/15/23 0430 106/72 -- -- 71 -- --  05/15/23 0415 108/70 -- -- 81 -- --  05/15/23 0400 105/68 -- -- 78 -- --  05/15/23 0345 108/66 -- -- 67 -- --  05/15/23 0330 109/68 -- -- 79 -- --  05/15/23 0315 118/65 -- -- 96 -- --  05/15/23 0300 110/64 98 F (36.7 C) Oral 69 16 --  05/15/23 0245 108/68 -- -- 74 -- --  05/15/23 0230 112/67 -- -- 73 -- --  05/15/23 0222 112/66 -- -- 70 -- --  05/15/23 0217 113/68 -- -- 76 -- --  05/15/23 0212 106/65 -- -- 70 -- --  05/15/23 0207 111/62 -- -- 75 -- 100 %  05/15/23 0202 109/63 -- -- 69 -- 100 %  05/15/23 0157 (!) 105/59 -- -- 68 -- 100 %  05/15/23 0152 109/63 -- -- 68 -- 99 %  05/15/23 0147 111/64 -- -- 71 -- 100 %  05/15/23 0142 118/76 -- -- 77 -- 99 %  05/15/23 0139 -- -- -- -- -- 99 %  05/15/23 0137 (!) 107/58 -- -- 69 -- 100 %   Comfortable w/epidural.  FHR w/some variable decels, responds to position changes.  Moderate variability and + accels.  Ctx irregular, q 1-4 minutes, pitocin at 2 mu/min.  Cx C/C/+1. AROM w/clear fluid.  Anticipate delivery soon

## 2023-05-15 NOTE — Anesthesia Preprocedure Evaluation (Signed)
Anesthesia Evaluation  Patient identified by MRN, date of birth, ID band Patient awake    Reviewed: Allergy & Precautions, NPO status , Patient's Chart, lab work & pertinent test results  Airway Mallampati: II  TM Distance: >3 FB Neck ROM: Full    Dental no notable dental hx.    Pulmonary neg pulmonary ROS   Pulmonary exam normal breath sounds clear to auscultation       Cardiovascular negative cardio ROS Normal cardiovascular exam Rhythm:Regular Rate:Normal     Neuro/Psych negative neurological ROS  negative psych ROS   GI/Hepatic negative GI ROS, Neg liver ROS,,,  Endo/Other  negative endocrine ROSdiabetes, Gestational    Renal/GU negative Renal ROS  negative genitourinary   Musculoskeletal negative musculoskeletal ROS (+)    Abdominal   Peds  Hematology negative hematology ROS (+)   Anesthesia Other Findings TOLAC  Reproductive/Obstetrics (+) Pregnancy                             Anesthesia Physical Anesthesia Plan  ASA: 3  Anesthesia Plan: Epidural   Post-op Pain Management:    Induction:   PONV Risk Score and Plan: Treatment may vary due to age or medical condition  Airway Management Planned: Natural Airway  Additional Equipment:   Intra-op Plan:   Post-operative Plan:   Informed Consent: I have reviewed the patients History and Physical, chart, labs and discussed the procedure including the risks, benefits and alternatives for the proposed anesthesia with the patient or authorized representative who has indicated his/her understanding and acceptance.       Plan Discussed with: Anesthesiologist  Anesthesia Plan Comments: (Patient identified. Risks, benefits, options discussed with patient including but not limited to bleeding, infection, nerve damage, paralysis, failed block, incomplete pain control, headache, blood pressure changes, nausea, vomiting, reactions to  medication, itching, and post partum back pain. Confirmed with bedside nurse the patient's most recent platelet count. Confirmed with the patient that they are not taking any anticoagulation, have any bleeding history or any family history of bleeding disorders. Patient expressed understanding and wishes to proceed. All questions were answered. )       Anesthesia Quick Evaluation

## 2023-05-15 NOTE — Plan of Care (Signed)

## 2023-05-15 NOTE — H&P (Signed)
LABOR AND DELIVERY ADMISSION HISTORY AND PHYSICAL NOTE  Amy Greene is a 32 y.o. female G2P1001 with IUP at [redacted]w[redacted]d presenting for SOL.   Patient reports the fetal movement as active. Patient reports uterine contraction  activity as regular, every 3 minutes. Patient reports  vaginal bleeding as none. Patient describes fluid per vagina as None.   Patient denies headache, vision changes, chest pain, shortness of breath, right upper quadrant pain, or LE edema.  She plans on breast feeding feeding. Her contraception plan is:  unsure .  Prenatal History/Complications: PNC at [facility] C/b hx emergency C/S for fetal intolerance IUGR - resolved Anemia  Sono:  @[redacted]w[redacted]d , CWD, normal anatomy, cephalic presentation, posterior placenta, 24%ile  Pregnancy complications:  Patient Active Problem List   Diagnosis Date Noted   Normal labor 05/15/2023   Group B Streptococcus carrier, +RV culture, currently pregnant 04/27/2023   IUGR Resolved 03/05/2023   Anemia complicating pregnancy, third trimester 02/18/2023   Obesity affecting pregnancy 02/18/2023   History of emergency cesarean delivery 02/17/2023   Supervision of high-risk pregnancy 02/17/2023   Umbilical hernia without obstruction and without gangrene 02/17/2023    Past Medical History: Past Medical History:  Diagnosis Date   Anemia    Gestational diabetes 04/03/2023    Past Surgical History: Past Surgical History:  Procedure Laterality Date   CESAREAN SECTION      Obstetrical History: OB History     Gravida  2   Para  1   Term  1   Preterm  0   AB  0   Living  1      SAB  0   IAB  0   Ectopic  0   Multiple  0   Live Births  1           Social History: Social History   Socioeconomic History   Marital status: Single    Spouse name: Not on file   Number of children: Not on file   Years of education: Not on file   Highest education level: Not on file  Occupational History   Not on file  Tobacco Use    Smoking status: Never   Smokeless tobacco: Not on file  Vaping Use   Vaping status: Never Used  Substance and Sexual Activity   Alcohol use: No    Comment: social   Drug use: Not Currently   Sexual activity: Not Currently    Birth control/protection: None  Other Topics Concern   Not on file  Social History Narrative   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     Not currently exercising.          Social Determinants of Health   Financial Resource Strain: Low Risk  (11/18/2022)   Received from [facility], [facility]   Overall Financial Resource Strain (CARDIA)    Difficulty of Paying Living Expenses: Not very hard  Food Insecurity: No Food Insecurity (11/18/2022)   Received from [facility], [facility]   Hunger Vital Sign    Worried About Running Out of Food in the Last Year: Never true    Ran Out of Food in the Last Year: Never true  Transportation Needs: No Transportation Needs (11/18/2022)   Received from [facility], [facility]   PRAPARE - Transportation    Lack of Transportation (Medical): No    Lack of  Transportation (Non-Medical): No  Physical Activity: Sufficiently Active (11/18/2022)   Received from [facility], [facility]   Exercise Vital Sign    Days of Exercise per Week: 5 days    Minutes of Exercise per Session: 150+ min  Stress: No Stress Concern Present (11/18/2022)   Received from [facility], [facility]   [facility] of Occupational Health - Occupational Stress Questionnaire    Feeling of Stress : Not at all  Social Connections: Moderately Isolated (11/18/2022)   Received from [facility], [facility]   Social  Connection and Isolation Panel [NHANES]    Frequency of Communication with Friends and Family: More than three times a week    Frequency of Social Gatherings with Friends and Family: More than three times a week    Attends Religious Services: More than 4 times per year    Active Member of [employer] or Organizations: No    Attends Engineer, structural: Never    Marital Status: Never married    Family History: Family History  Problem Relation Age of Onset   Diabetes Neg Hx    Hypertension Neg Hx    Asthma Neg Hx    Cancer Neg Hx    Heart disease Neg Hx     Allergies: No Known Allergies  Medications Prior to Admission  Medication Sig Dispense Refill Last Dose   ferrous sulfate 325 (65 FE) MG EC tablet Take 1 tablet (325 mg total) by mouth every other day. 30 tablet 2 05/15/2023   Prenatal Vit-Fe Fumarate-FA (MULTIVITAMIN-PRENATAL) 27-0.8 MG TABS tablet Take 1 tablet by mouth daily at 12 noon.   05/15/2023   polyethylene glycol powder (GLYCOLAX/MIRALAX) 17 GM/SCOOP powder Take 17 g by mouth daily as needed. 510 g 5      Review of Systems  All systems reviewed and negative except as stated in HPI  Physical Exam BP 135/78 (BP Location: Right Arm)   Pulse 80   Temp (!) 97.4 F (36.3 C)   Resp 19   Ht 5\' 5"  (1.651 m)   Wt 91.2 kg   LMP 07/23/2022   SpO2 99%   BMI 33.45 kg/m   Physical Exam Constitutional:      General: She is in acute distress.     Comments: With ctx  Cardiovascular:     Rate and Rhythm: Normal rate and regular rhythm.  Pulmonary:     Effort: Pulmonary effort is normal.  Abdominal:     Comments: Gravid  Musculoskeletal:        General: No swelling.  Skin:    General: Skin is warm and dry.  Neurological:     Mental Status: Mental status is at baseline.  Psychiatric:        Mood and Affect: Mood normal.   Presentation: cephalic by check  Fetal monitoring: Baseline: 130 bpm, Variability: Good {> 6 bpm), Accelerations: Reactive, and  Decelerations: Absent Uterine activity: Q3 minutes  Dilation: 5 Effacement (%): 70 Cervical Position: Middle Station: -2 Presentation: Vertex Exam by:: Felipa Furnace RN  Prenatal labs: ABO, Rh: --/--/PENDING (10/11 0005) Antibody: PENDING (10/11 0005) Rubella: Nonimmune (04/16 0000) RPR: Non Reactive (08/28 0820)  HBsAg: Negative (04/16 0000)  HIV: Non Reactive (08/28 0820)  GC/Chlamydia:  Neisseria Gonorrhea  Date Value Ref Range Status  04/22/2023 Negative  Final   Chlamydia  Date Value Ref Range Status  04/22/2023 Negative  Final   GBS: Positive/-- (  09/19 0813)    Prenatal Transfer Tool  Maternal Diabetes: No Genetic Screening: Normal Maternal Ultrasounds/Referrals: IUGR - now resolved Fetal Ultrasounds or other Referrals:  None Maternal Substance Abuse:  No Significant Maternal Medications:  None Significant Maternal Lab Results: Group B Strep positive  Results for orders placed or performed during the hospital encounter of 05/14/23 (from the past 24 hour(s))  CBC   Collection Time: 05/15/23 12:05 AM  Result Value Ref Range   WBC 8.6 4.0 - 10.5 K/uL   RBC 3.93 3.87 - 5.11 MIL/uL   Hemoglobin 11.5 (L) 12.0 - 15.0 g/dL   HCT 16.1 (L) 09.6 - 04.5 %   MCV 89.6 80.0 - 100.0 fL   MCH 29.3 26.0 - 34.0 pg   MCHC 32.7 30.0 - 36.0 g/dL   RDW 40.9 81.1 - 91.4 %   Platelets 214 150 - 400 K/uL   nRBC 0.0 0.0 - 0.2 %  Type and screen   Collection Time: 05/15/23 12:05 AM  Result Value Ref Range   ABO/RH(D) PENDING    Antibody Screen PENDING    Sample Expiration      05/18/2023,2359 Performed at [facility], [address], [place], [place] [address]     Assessment: Amy Greene is a 32 y.o. G2P1001 at [redacted]w[redacted]d here for SOL. TOLAC  #Labor: Plan to start PCN. AROM after epidural and 4 hours #Pain: Planning epidural #FHT: Category I #GBS/ID: Positive > PCN #MOF: breast feeding #MOC:  unsure  #Anemia: mild. Hgb 11.5 at admit  Joanne Gavel, MD [facility]  Fellow Center for [facility], [facility]  05/15/2023, 12:38 AM

## 2023-05-15 NOTE — Discharge Summary (Signed)
Postpartum Discharge Summary     Patient Name: Amy Greene DOB: [birth year] MRN: [id number]  Date of admission: 05/14/2023 Delivery date:05/15/2023 Delivering provider: Jacklyn Shell Date of discharge: 05/17/2023  Admitting diagnosis: Post term pregnancy [O48.0] Intrauterine pregnancy: [redacted]w[redacted]d     Secondary diagnosis:  Principal Problem:   Normal labor Active Problems:   History of emergency cesarean delivery   Supervision of high-risk pregnancy   Umbilical hernia without obstruction and without gangrene   Anemia complicating pregnancy, third trimester   Obesity affecting pregnancy   IUGR Resolved   Group B Streptococcus carrier, +RV culture, currently pregnant   VBAC, delivered  Additional problems: none    Discharge diagnosis: Term Pregnancy Delivered and VBAC                                              Post partum procedures: none Augmentation: AROM and Pitocin Complications: None  Hospital course: Onset of Labor With Vaginal Delivery      32 y.o. yo [id number] at [redacted]w[redacted]d was admitted in Active Labor on 05/14/2023. Labor course was complicated by noting  Membrane Rupture Time/Date: 5:14 AM,05/15/2023  Delivery Method:VBAC, Spontaneous Operative Delivery:N/A Episiotomy: None Lacerations:  None Patient had a postpartum course complicated by  nothing.  She is ambulating, tolerating a regular diet, passing flatus, and urinating well. Patient is discharged home in stable condition on 05/17/23.  Newborn Data: Birth date:05/15/2023 Birth time:6:26 AM Gender:Female Living status:Living Apgars:8 ,9  Weight:2980 g  Magnesium Sulfate received: No BMZ received: No Rhophylac:N/A MMR:Yes T-DaP:Given prenatally Flu: N/A RSV Vaccine received: No Transfusion:No  Immunizations received: Immunization History  Administered Date(s) Administered   Influenza Split 08/27/2011   Influenza Whole 07/20/2007   Td 02/25/2008   Tdap 02/18/2023    Physical exam   Vitals:   05/16/23 0623 05/16/23 1343 05/16/23 2142 05/17/23 0530  BP: 104/75 100/67 115/67 109/68  Pulse: 79 75 74 77  Resp: 18 18 20 20   Temp: 98.4 F (36.9 C) 98.2 F (36.8 C) 98.2 F (36.8 C) 98.2 F (36.8 C)  TempSrc: Oral Oral Oral Oral  SpO2: 100% 100% 100% 100%  Weight:      Height:       General: alert, cooperative, and no distress Lochia: appropriate Uterine Fundus: firm DVT Evaluation: No evidence of DVT seen on physical exam. Labs: Lab Results  Component Value Date   WBC 8.6 05/15/2023   HGB 11.5 (L) 05/15/2023   HCT 35.2 (L) 05/15/2023   MCV 89.6 05/15/2023   PLT 214 05/15/2023       No data to display         Edinburgh Score:    05/16/2023   10:15 AM  Edinburgh Postnatal Depression Scale Screening Tool  I have been able to laugh and see the funny side of things. 0  I have looked forward with enjoyment to things. 0  I have blamed myself unnecessarily when things went wrong. 1  I have been anxious or worried for no good reason. 0  I have felt scared or panicky for no good reason. 0  Things have been getting on top of me. 1  I have been so unhappy that I have had difficulty sleeping. 0  I have felt sad or miserable. 0  I have been so unhappy that I have been crying. 0  The thought of harming  myself has occurred to me. 0  Edinburgh Postnatal Depression Scale Total 2   Edinburgh Postnatal Depression Scale Total: 2   After visit meds:  Allergies as of 05/17/2023   No Known Allergies      Medication List     TAKE these medications    ferrous sulfate 325 (65 FE) MG EC tablet Take 1 tablet (325 mg total) by mouth every other day.   ibuprofen 600 MG tablet Commonly known as: ADVIL Take 1 tablet (600 mg total) by mouth every 6 (six) hours.   PRENATAL PO Take 1 tablet by mouth daily.         Discharge home in stable condition Infant Feeding: Breast Infant Disposition:home with mother Discharge instruction: per After Visit Summary  and Postpartum booklet. Activity: Advance as tolerated. Pelvic rest for 6 weeks.  Diet: routine diet Future Appointments: No future appointments.  Follow up Visit:  Follow-up Information     [facility] Follow up in 4 week(s).   Specialty: Obstetrics and Gynecology Why: pp check Contact information: [address] [place] [address] [phone]                 Please schedule this patient for a In person postpartum visit in 4 weeks with the following provider: Any provider. Additional Postpartum F/U:  Low risk pregnancy complicated by:  Delivery mode:  VBAC, Spontaneous Anticipated Birth Control:  Nexplanon   05/17/2023 Reva Bores, MD

## 2023-05-16 MED ORDER — ETONOGESTREL 68 MG ~~LOC~~ IMPL
68.0000 mg | DRUG_IMPLANT | Freq: Once | SUBCUTANEOUS | Status: DC
  Filled 2023-05-16: qty 1

## 2023-05-16 MED ORDER — LIDOCAINE HCL 1 % IJ SOLN
0.0000 mL | Freq: Once | INTRAMUSCULAR | Status: DC | PRN
  Filled 2023-05-16: qty 20

## 2023-05-16 NOTE — Anesthesia Postprocedure Evaluation (Signed)
Anesthesia Post Note  Patient: Product/process development scientist  Procedure(s) Performed: AN AD HOC LABOR EPIDURAL     Patient location during evaluation: Mother Baby Anesthesia Type: Epidural Level of consciousness: awake and alert and oriented Pain management: satisfactory to patient Vital Signs Assessment: post-procedure vital signs reviewed and stable Respiratory status: respiratory function stable Cardiovascular status: stable Postop Assessment: no headache, no backache, epidural receding, patient able to bend at knees, no signs of nausea or vomiting, adequate PO intake and able to ambulate Anesthetic complications: no   No notable events documented.  Last Vitals:  Vitals:   05/15/23 2130 05/16/23 0623  BP: 116/66 104/75  Pulse: 92 79  Resp: 17 18  Temp: 37 C 36.9 C  SpO2: 100% 100%    Last Pain:  Vitals:   05/16/23 1015  TempSrc:   PainSc: 5    Pain Goal: Patients Stated Pain Goal: 3 (05/15/23 1257)                 Carlos American

## 2023-05-16 NOTE — Progress Notes (Signed)
Post Partum Day 1  Subjective: no complaints, up ad lib, and voiding  Objective: Blood pressure 104/75, pulse 79, temperature 98.4 F (36.9 C), temperature source Oral, resp. rate 18, height 5\' 5"  (1.651 m), weight 91.2 kg, last menstrual period 07/23/2022, SpO2 100%, unknown if currently breastfeeding.  Physical Exam:  General: alert, cooperative, and appears stated age Lochia: appropriate Uterine Fundus: firm DVT Evaluation: No evidence of DVT seen on physical exam.  Recent Labs    05/13/23 1412 05/15/23 0005  HGB 11.1* 11.5*  HCT  --  35.2*    Assessment/Plan: Plan for discharge tomorrow, Breastfeeding, and Contraception undecided   LOS: 1 day   Reva Bores, MD 05/16/2023, 7:55 AM

## 2023-05-16 NOTE — Lactation Note (Signed)
This note was copied from a baby's chart. Lactation Consultation Note  Patient Name: Amy Greene [id number] Date: 05/16/2023 Age:34 hours Reason for consult: Follow-up assessment;Mother's request;Difficult latch;Term  Visited with P2 parent per mom's request for latch observation. LC observed latch - encouraged mom to feed infant skin to skin instead of with clothes on due to better latching when STS. Mom has larger nipples, so LC reviewed deep latch techniques and mom remembers doing this with her first baby 5 years ago.   Feeding Plan 1) Breastfeed infant skin to skin every 2-3 hours, or sooner if infant cues. 2) Call RN/LC for latch assistance.   Feeding Mother's Current Feeding Choice: Breast Milk and Formula  LATCH Score Latch: Repeated attempts needed to sustain latch, nipple held in mouth throughout feeding, stimulation needed to elicit sucking reflex.  Audible Swallowing: A few with stimulation  Type of Nipple: Everted at rest and after stimulation  Comfort (Breast/Nipple): Soft / non-tender  Hold (Positioning): No assistance needed to correctly position infant at breast.  LATCH Score: 8   Lactation Tools Discussed/Used    Interventions Interventions: Breast feeding basics reviewed;Assisted with latch;Skin to skin;Education  Discharge    Consult Status Consult Status: Follow-up Date: 05/17/23 Follow-up type: In-patient    Antionette Char 05/16/2023, 12:52 PM

## 2023-05-16 NOTE — Lactation Note (Signed)
This note was copied from a baby's chart. Lactation Consultation Note  Patient Name: Amy Greene Date: 05/16/2023 Age:34 hours Reason for consult: Follow-up assessment;Term  Visited P2 parent of term infant for follow up consult. MOB recently fed infant and she feels feeding is going well. MOB reports no pain, tugging & pulling feeling at the breast. MOB breastfed her 34 year old for 2 years and states "it is all coming back to her". FOB in the room, and family was in good spirits deciding on spelling of middle name. MOB reports she wants to get up, wash up, walk around and get moving so she can feel like a human again. MOB stated she would like lactation to observe a latch to ensure she is doing it right since it has been a few years she breastfed regularly. MOB knows to call out for latch observation when she is ready. LC reviewed normal newborn behavior at this age and what to expect in the next 24 hours.   Feeding Mother's Current Feeding Choice: Breast Milk  Interventions Interventions: Breast feeding basics reviewed;Education  Discharge    Consult Status Consult Status: Follow-up Date: 05/17/23 Follow-up type: In-patient    Antionette Char 05/16/2023, 11:21 AM

## 2023-05-17 MED ORDER — LIDOCAINE HCL 1 % IJ SOLN: 0.0000 mL | Freq: Once | INTRAMUSCULAR | Status: DC | PRN

## 2023-05-17 MED ORDER — IBUPROFEN 600 MG PO TABS: 600.0000 mg | ORAL_TABLET | Freq: Four times a day (QID) | ORAL | 0 refills | Status: DC

## 2023-05-17 MED ORDER — ETONOGESTREL 68 MG ~~LOC~~ IMPL: 68.0000 mg | DRUG_IMPLANT | Freq: Once | SUBCUTANEOUS | Status: DC

## 2023-05-17 NOTE — Lactation Note (Signed)
This note was copied from a baby's chart. Lactation Consultation Note  Patient Name: Amy Greene [id number] Date: 05/17/2023 Age:34 hours Reason for consult: Follow-up assessment  P2, Mother's breasts are filling heading toward engorgement.  Observed baby sucking on a pacifier.  Recommend waiting on pacifier for 3 weeks. Feed baby on demand.  Mother is getting ready to take a shower.  Suggest putting her back toward shower head.  Mother plans to pump after shower or recommend feeding baby also.   Reviewed engorgement care and monitoring voids/stools.  Maternal Data Has patient been taught Hand Expression?: Yes  Feeding Mother's Current Feeding Choice: Breast Milk  LATCH Score Latch: Repeated attempts needed to sustain latch, nipple held in mouth throughout feeding, stimulation needed to elicit sucking reflex.  Audible Swallowing: A few with stimulation  Type of Nipple: Everted at rest and after stimulation  Comfort (Breast/Nipple): Filling, red/small blisters or bruises, mild/mod discomfort  Hold (Positioning): Assistance needed to correctly position infant at breast and maintain latch.  LATCH Score: 6   Lactation Tools Discussed/Used Tools: Pump;Flanges Breast pump type: Double-Electric Breast Pump Pump Education: Milk Storage Reason for Pumping: mother's choice Pumping frequency: PRN  Interventions Interventions: DEBP;Ice;Education  Discharge Discharge Education: Engorgement and breast care;Warning signs for feeding baby Pump: DEBP;Personal;Hands Free;Manual  Consult Status Consult Status: Complete Date: 05/17/23    Dahlia Byes [facility] 05/17/2023, 9:10 AM

## 2023-05-17 NOTE — Discharge Instructions (Signed)
WHAT TO LOOK OUT FOR: Fever of 100.4 or above Mastitis: feels like flu and breasts hurt Infection: increased pain, swelling or redness Blood clots golf ball size or larger Postpartum depression   Congratulations on your newest addition!

## 2023-05-19 DIAGNOSIS — Z0289 Encounter for other administrative examinations: Secondary | ICD-10-CM

## 2023-05-20 ENCOUNTER — Ambulatory Visit: Payer: BLUE CROSS/BLUE SHIELD

## 2023-05-20 ENCOUNTER — Encounter: Payer: BLUE CROSS/BLUE SHIELD | Admitting: Certified Nurse Midwife

## 2023-05-22 DIAGNOSIS — Z0289 Encounter for other administrative examinations: Secondary | ICD-10-CM

## 2023-05-26 ENCOUNTER — Inpatient Hospital Stay (HOSPITAL_COMMUNITY)
Admission: RE | Admit: 2023-05-26 | Payer: BLUE CROSS/BLUE SHIELD | Source: Home / Self Care | Admitting: Obstetrics and Gynecology

## 2023-05-26 ENCOUNTER — Inpatient Hospital Stay (HOSPITAL_COMMUNITY): Payer: BLUE CROSS/BLUE SHIELD

## 2023-06-09 ENCOUNTER — Telehealth (HOSPITAL_COMMUNITY): Payer: Self-pay | Admitting: *Deleted

## 2023-06-09 NOTE — Telephone Encounter (Signed)
06/09/2023  Name: Amy Greene MRN: [id number] DOB: May 12, 1991  Reason for Call:  Transition of Care Hospital Discharge Call  Contact Status: Patient Contact Status: Complete  Language assistant needed: Interpreter Mode: Interpreter Not Needed        Follow-Up Questions: Do You Have Any Concerns About Your Health As You Heal From Delivery?: No Do You Have Any Concerns About Your Infants Health?: No  Edinburgh Postnatal Depression Scale:  In the Past 7 Days: I have been able to laugh and see the funny side of things.: Not quite so much now I have looked forward with enjoyment to things.: Rather less than I used to I have blamed myself unnecessarily when things went wrong.: Not very often I have been anxious or worried for no good reason.: Hardly ever I have felt scared or panicky for no good reason.: No, not at all Things have been getting on top of me.: Yes, sometimes I haven't been coping as well as usual I have been so unhappy that I have had difficulty sleeping.: Not at all I have felt sad or miserable.: Not very often I have been so unhappy that I have been crying.: No, never The thought of harming myself has occurred to me.: Never Edinburgh Postnatal Depression Scale Total: 7  PHQ2-9 Depression Scale:     Discharge Follow-up: Edinburgh score requires follow up?: No Patient was advised of the following resources:: Support Group, Breastfeeding Support Group  Post-discharge interventions: Reviewed Newborn Safe Sleep Practices  Malachy Mood  06/09/2023 1524

## 2023-06-16 ENCOUNTER — Encounter: Payer: Self-pay | Admitting: Obstetrics and Gynecology

## 2023-06-16 ENCOUNTER — Ambulatory Visit: Payer: Medicaid Other | Admitting: Obstetrics and Gynecology

## 2023-06-16 ENCOUNTER — Other Ambulatory Visit (HOSPITAL_COMMUNITY)
Admission: RE | Admit: 2023-06-16 | Discharge: 2023-06-16 | Disposition: A | Discharge status: Home / Self Care | Payer: Medicaid Other | Source: Ambulatory Visit | Attending: Obstetrics and Gynecology | Admitting: Obstetrics and Gynecology

## 2023-06-16 ENCOUNTER — Other Ambulatory Visit: Payer: Self-pay

## 2023-06-16 DIAGNOSIS — O34219 Maternal care for unspecified type scar from previous cesarean delivery: Secondary | ICD-10-CM

## 2023-06-16 DIAGNOSIS — Z30017 Encounter for initial prescription of implantable subdermal contraceptive: Secondary | ICD-10-CM | POA: Diagnosis not present

## 2023-06-16 DIAGNOSIS — F53 Postpartum depression: Secondary | ICD-10-CM | POA: Diagnosis not present

## 2023-06-16 DIAGNOSIS — K429 Umbilical hernia without obstruction or gangrene: Secondary | ICD-10-CM | POA: Diagnosis not present

## 2023-06-16 DIAGNOSIS — Z124 Encounter for screening for malignant neoplasm of cervix: Secondary | ICD-10-CM | POA: Insufficient documentation

## 2023-06-16 DIAGNOSIS — K5900 Constipation, unspecified: Secondary | ICD-10-CM | POA: Diagnosis not present

## 2023-06-16 MED ORDER — ETONOGESTREL 68 MG ~~LOC~~ IMPL
68.0000 mg | DRUG_IMPLANT | Freq: Once | SUBCUTANEOUS | Status: AC
Start: 2023-06-16 — End: 2023-06-16
  Administered 2023-06-16: 68 mg via SUBCUTANEOUS

## 2023-06-16 MED ORDER — SENNA 8.6 MG PO TABS
1.0000 | ORAL_TABLET | Freq: Every day | ORAL | 0 refills | Status: AC | PRN
Start: 2023-06-16 — End: ?

## 2023-06-16 MED ORDER — POLYETHYLENE GLYCOL 3350 17 GM/SCOOP PO POWD
17.0000 g | Freq: Every day | ORAL | 0 refills | Status: AC
Start: 2023-06-16 — End: ?

## 2023-06-16 NOTE — Progress Notes (Signed)
Post Partum Visit Note  Amy Greene is a 32 y.o. G70P2002 female who presents for a postpartum visit. She is 4 weeks postpartum following a VBAC.  I have fully reviewed the prenatal and intrapartum course. The delivery was at 39.3 gestational weeks.  Anesthesia: epidural. Postpartum course has been . Baby is doing well. Baby is feeding by breast. Bleeding staining only. Bowel function is normal. Bladder function is normal. Patient is not sexually active. Contraception method is  nexplanon . Postpartum depression screening: negative.   The pregnancy intention screening data noted above was reviewed. Potential methods of contraception were discussed. The patient elected to proceed with nexplanon    Health Maintenance Due  Topic Date Due   Cervical Cancer Screening (HPV/Pap Cotest)  09/29/2018   COVID-19 Vaccine (1 - 2023-24 season) Never done    The following portions of the patient's history were reviewed and updated as appropriate: allergies, current medications, past family history, past medical history, past social history, past surgical history, and problem list.  Review of Systems Pertinent items are noted in HPI.  Objective:  LMP 07/23/2022    General:  alert and cooperative   Breasts:  normal  Lungs: Normal effort  Heart:  Normal rate  Abdomen: soft    Wound N/a  GU exam:  normal       Nexplanon Insertion Procedure Patient identified, informed consent performed, consent signed.   Patient does understand that irregular bleeding is a very common side effect of this medication. She was advised to have backup contraception for one week after placement. Pregnancy test in clinic today was negative.  Appropriate time out taken.    Patient's left arm was prepped and draped in the usual sterile fashion. The ruler used to measure and mark insertion area.  Patient was prepped with alcohol swab and then injected with 3 ml of 1% lidocaine.  She was prepped with betadine, Nexplanon  removed from packaging,  Device confirmed in needle, then inserted full length of needle and withdrawn per handbook instructions. Nexplanon was able to palpated in the patient's arm; patient palpated the insert herself. There was minimal blood loss.  Patient insertion site covered with guaze and a pressure bandage to reduce any bruising.    The patient tolerated the procedure well and was given post procedure instructions.   Assessment:   1. Postpartum exam   2. VBAC, delivered Successful vbac  3. Postpartum depression Referral to [facility] placed  - Amb ref to Integrated Behavioral Health  4. Umbilical hernia without obstruction and without gangrene  - Ambulatory referral to General Surgery  5. Constipation, unspecified constipation type  - polyethylene glycol powder (GLYCOLAX/MIRALAX) 17 GM/SCOOP powder; Take 17 g by mouth daily.  Dispense: 255 g; Refill: 0 - senna (SENOKOT) 8.6 MG TABS tablet; Take 1 tablet (8.6 mg total) by mouth daily as needed for mild constipation.  Dispense: 120 tablet; Refill: 0  6. Nexplanon insertion R/b discussed, discussed back up method, length of use.  Inserted today.  7. Cervical cancer screening  - Cytology - PAP( [name])    Plan:   Essential components of care per ACOG recommendations:  1.  Mood and well being: Patient with negative depression screening today. Reviewed local resources for support.  - Patient tobacco use? No.   - hx of drug use? No.    2. Infant care and feeding:  -Patient currently breastmilk feeding? Yes. Discussed returning to work and pumping. Reviewed importance of draining breast regularly to  support lactation.  -Social determinants of health (SDOH) reviewed in EPIC. No concerns  3. Sexuality, contraception and birth spacing - Patient does not want a pregnancy in the next year.  Desired family size is unsure children.  - Reviewed reproductive life planning. Reviewed contraceptive methods based on pt  preferences and effectiveness.  Patient desired Hormonal Implant today.   - Discussed birth spacing of 18 months  4. Sleep and fatigue -Encouraged family/partner/community support of 4 hrs of uninterrupted sleep to help with mood and fatigue  5. Physical Recovery  - Discussed patients delivery and complications. She describes her labor as good. - Patient had a Vaginal, no problems at delivery. Patient had a  none  laceration. Perineal healing reviewed. Patient expressed understanding - Patient has urinary incontinence? No. - Patient is safe to resume physical and sexual activity  6.  Health Maintenance - HM due items addressed Yes - Last pap smear No results found for: "DIAGPAP" Pap smear done at today's visit.  -Breast Cancer screening indicated? No.   7. Chronic Disease/Pregnancy Condition follow up: None  Albertine Grates, FNP Center for [employer], [facility]

## 2023-06-17 LAB — CYTOLOGY - PAP
Adequacy: ABSENT
Chlamydia: NEGATIVE
Comment: NEGATIVE
Comment: NEGATIVE
Comment: NEGATIVE
Comment: NORMAL
Diagnosis: NEGATIVE
High risk HPV: NEGATIVE
Neisseria Gonorrhea: NEGATIVE
Trichomonas: NEGATIVE

## 2023-06-18 ENCOUNTER — Encounter: Payer: Self-pay | Admitting: General Surgery

## 2023-06-18 ENCOUNTER — Ambulatory Visit: Payer: Medicaid Other | Admitting: General Surgery

## 2023-06-18 ENCOUNTER — Ambulatory Visit (INDEPENDENT_AMBULATORY_CARE_PROVIDER_SITE_OTHER): Payer: Medicaid Other | Admitting: General Surgery

## 2023-06-18 VITALS — BP 106/72 | HR 86 | Temp 99.0°F | Ht 65.0 in | Wt 190.0 lb

## 2023-06-18 DIAGNOSIS — K429 Umbilical hernia without obstruction or gangrene: Secondary | ICD-10-CM | POA: Diagnosis not present

## 2023-06-18 NOTE — Patient Instructions (Addendum)
See follow up appointment.   Umbilical Hernia, Adult  A hernia is a lump of tissue that pushes through an opening in the muscles. An umbilical hernia happens in the belly, near the belly button. The hernia may contain tissues from the small or large intestine. It may also have fatty tissue that covers the intestines. Umbilical hernias in adults may get worse over time. They need to be treated with surgery. There are several types of umbilical hernias. They include: Indirect hernia. This occurs just above or below the belly button. It's the most common type of umbilical hernia in adults. Direct hernia. This type occurs in an opening that's formed by the belly button. Reducible hernia. This hernia comes and goes. You may see it only when you strain, cough, or lift something heavy. This type of hernia can be pushed back into the belly (reduced). Incarcerated hernia. This traps the hernia in the wall of the belly. This type of hernia can't be pushed back into the belly. It can cause a strangulated hernia. Strangulated hernia. This hernia cuts off blood flow to the tissues inside the hernia. The tissues can die if this happens. This type of hernia must be treated right away. What are the causes? An umbilical hernia happens when tissue inside the belly pushes through an opening in the muscles of the belly. What increases the risk? You're more likely to get this hernia if: You strain while lifting or pushing heavy objects. You've had several pregnancies. You have a condition that puts pressure on your belly, and you've had it for a long time. These include: Obesity. A buildup of fluid inside your belly. Vomiting or coughing all the time. Trouble pooping (constipation). You've had surgery that weakened the muscles in the belly. What are the signs or symptoms? The main symptom of this condition is a bulge at the belly button or near it. The bulge does not cause pain. Other symptoms depend on the  type of hernia you have. A reducible hernia may be seen only when you strain, cough, or lift something heavy. Other symptoms may include: Dull pain. A feeling of pressure. An incarcerated hernia may cause very bad pain. Also, you may: Vomit or feel like you may vomit. Not be able to pass gas. A strangulated hernia may cause: Pain that gets worse and worse. Vomiting, or feeling like you may vomit. Pain when you press on the hernia. Change of color on the skin over the hernia. The skin may become red or purple. Trouble pooping. Blood in the poop. How is this diagnosed? This condition may be diagnosed based on: Your symptoms and medical history. A physical exam. You may be asked to cough or strain while standing. These actions will put pressure inside your belly. The pressure can force the hernia through the opening in your muscles. Your health care provider may try to push the hernia back into your belly (reduce). How is this treated? Surgery is the only treatment for an umbilical hernia. Surgery for a strangulated hernia must be done right away. If you have a small hernia that's not incarcerated, you may need to lose weight before the surgery is done. Follow these instructions at home: Managing constipation You may need to take these actions to prevent trouble pooping. This will help to prevent straining. Drink enough fluid to keep your pee (urine) pale yellow. Take over-the-counter or prescription medicines. Eat foods that are high in fiber, such as beans, whole grains, and fresh fruits and vegetables.  Limit foods that are high in fat and sugars, such as fried or sweet foods. General instructions Do not try to push the hernia back in. Lose weight, if told by your provider. Watch your hernia for any changes in color or size. Tell your provider if any changes occur. You may need to avoid activities that put pressure on your hernia. You may have to avoid lifting. Ask your provider how  much you can safely lift. Take over-the-counter and prescription medicines only as told by your provider. Contact a health care provider if: Your hernia gets larger or feels hard. Your hernia becomes painful. You get a fever or chills. Get help right away if: You get very bad pain near the area of the hernia, and the pain comes on suddenly. You have pain and you vomit or feel like you may vomit. The skin over your hernia changes color. These symptoms may be an emergency. Get help right away. Call 911. Do not wait to see if the symptoms go away. Do not drive yourself to the hospital. This information is not intended to replace advice given to you by your health care provider. Make sure you discuss any questions you have with your health care provider. Document Revised: 11/11/2022 Document Reviewed: 11/11/2022 Elsevier Patient Education  2024 [employer].

## 2023-06-23 NOTE — Progress Notes (Signed)
Patient ID: Amy Greene, female   DOB: 1991-02-24, 32 y.o.   MRN: [id number] CC: Umbilical Hernia History of Present Illness Amy Greene is a 32 y.o. female with umbilical hernia.  The patient is a G2 P2-0-0-2 status post delivery of baby 5 weeks who presents with umbilical hernia.  She reports that she noticed umbilical hernia after her first pregnancy about 2 years ago.  She says that it has increased in size and she started to have occasional pain with it.  She denies any nausea vomiting fevers or chills.  She does report some constipation. She is unsure if she wil have any additional children.   Past Medical History Past Medical History:  Diagnosis Date   Anemia    Gestational diabetes 04/03/2023       Past Surgical History:  Procedure Laterality Date   CESAREAN SECTION      No Known Allergies  Current Outpatient Medications  Medication Sig Dispense Refill   polyethylene glycol powder (GLYCOLAX/MIRALAX) 17 GM/SCOOP powder Take 17 g by mouth daily. 255 g 0   Prenatal Vit-Fe Fumarate-FA (PRENATAL PO) Take 1 tablet by mouth daily.     senna (SENOKOT) 8.6 MG TABS tablet Take 1 tablet (8.6 mg total) by mouth daily as needed for mild constipation. 120 tablet 0   No current facility-administered medications for this visit.    Family History Family History  Problem Relation Age of Onset   Diabetes Neg Hx    Hypertension Neg Hx    Asthma Neg Hx    Cancer Neg Hx    Heart disease Neg Hx        Social History Social History   Tobacco Use   Smoking status: Never  Vaping Use   Vaping status: Never Used  Substance Use Topics   Alcohol use: No    Comment: social   Drug use: Not Currently        ROS Full ROS of systems performed and is otherwise negative there than what is stated in the HPI  Physical Exam Blood pressure 106/72, pulse 86, temperature 99 F (37.2 C), temperature source Oral, height 5\' 5"  (1.651 m), weight 190 lb (86.2 kg), SpO2 100%, currently  breastfeeding.  No acute distress, PERRLA, moving all extremities spontaneously, normal work of breathing on room air Abdomen is soft, nondistended.  She has a umbilical hernia that is fat-containing and reducible.  The defect seems to measure about 3 cm.  There is some pain with reduction of the hernia but I was able to reduce it.  Data Reviewed I reviewed her delivery records as well as her past medical history and referral records.  She was referred to our office for an umbilical hernia.  I have personally reviewed the patient's imaging and medical records.    Assessment    32 year old female who is G2, P2 status post delivery 5 weeks ago who presents in evaluation for umbilical hernia.  Plan    I discussed with the patient that this is a common problem for women who have children.  I also discussed with her that if we do repair it there is a high risk of recurrence if she gets pregnant again.  I discussed with her that some hernias we do use mesh to help reduce the risk of recurrence.  She is unsure whether she wants to have additional children or not.  I think at this time then the best option is to do this open, primary repair.  That way, if  it recurs after her next child then we can attempt a more durable repair with mesh.  She understands this thought process and plan.  I also discussed with her that this is not an emergent procedure and can be done on an elective basis.  She would like to get through the holidays before any repair and I think this is appropriate.  We will plan to bring her into the office in December to set a date in January for repair    Kandis Cocking 06/23/2023, 12:55 PM

## 2023-06-25 ENCOUNTER — Ambulatory Visit: Payer: BLUE CROSS/BLUE SHIELD | Admitting: Certified Nurse Midwife

## 2023-07-06 NOTE — BH Specialist Note (Unsigned)
Integrated Behavioral Health Initial In-Person Visit  MRN: [id number] Name: Amy Greene  Number of Integrated Behavioral Health Clinician visits: No data recorded Session Start time: No data recorded   Session End time: No data recorded Total time in minutes: No data recorded  Types of Service: {CHL AMB TYPE OF [phone]}  Interpretor:No. Interpretor Name and Language: n/a   Warm Hand Off Completed.        Subjective: [name] is a 32 y.o. female accompanied by {CHL AMB ACCOMPANIED ON:[id number]} Patient was referred by Albertine Grates, FNP for postpartum depression. Patient reports the following symptoms/concerns: *** Duration of problem: ***; Severity of problem: {Mild/Moderate/Severe:20260}  Objective: Mood: {BHH MOOD:22306} and Affect: {BHH AFFECT:22307} Risk of harm to self or others: {CHL AMB BH Suicide Current Mental Status:21022748}  Life Context: Family and Social: *** School/Work: *** Self-Care: *** Life Changes: Recent childbirth***  Patient and/or Family's Strengths/Protective Factors: {CHL AMB BH PROTECTIVE FACTORS:[phone]}  Goals Addressed: Patient will: Reduce symptoms of: {IBH Symptoms:21014056} Increase knowledge and/or ability of: {IBH Patient Tools:21014057}  Demonstrate ability to: {IBH Goals:21014053}  Progress towards Goals: {CHL AMB BH PROGRESS TOWARDS GOALS:[phone]}  Interventions: Interventions utilized: {IBH Interventions:21014054}  Standardized Assessments completed: {IBH Screening Tools:21014051}  Patient and/or Family Response: ***  Patient Centered Plan: Patient is on the following Treatment Plan(s):  IBH  Assessment: Patient currently experiencing ***.   Patient may benefit from psychoeducation and brief therapeutic interventions regarding coping with symptoms of *** .  Plan: Follow up with behavioral health clinician on : *** Behavioral recommendations:  -*** -*** Referral(s): {IBH  Referrals:21014055}  Rae Lips, LCSW     02/17/2023    4:35 PM  Depression screen PHQ 2/9  Decreased Interest 0  Down, Depressed, Hopeless 0  PHQ - 2 Score 0  Altered sleeping 0  Tired, decreased energy 3  Change in appetite 0  Feeling bad or failure about yourself  0  Trouble concentrating 0  Moving slowly or fidgety/restless 0  Suicidal thoughts 0  PHQ-9 Score 3  Difficult doing work/chores Not difficult at all      02/17/2023    4:35 PM  GAD 7 : Generalized Anxiety Score  Nervous, Anxious, on Edge 0  Control/stop worrying 0  Worry too much - different things 0  Trouble relaxing 0  Restless 0  Easily annoyed or irritable 0  Afraid - awful might happen 0  Total GAD 7 Score 0  Anxiety Difficulty Not difficult at all       06/16/2023    8:50 AM 06/09/2023    3:14 PM 05/16/2023   10:15 AM 05/15/2023    9:30 PM  Edinburgh Postnatal Depression Scale Screening Tool  I have been able to laugh and see the funny side of things. 0 1 0 --  I have looked forward with enjoyment to things. 0 1 0   I have blamed myself unnecessarily when things went wrong. 0 1 1   I have been anxious or worried for no good reason. 2 1 0   I have felt scared or panicky for no good reason. 0 0 0   Things have been getting on top of me. 0 2 1   I have been so unhappy that I have had difficulty sleeping. 0 0 0   I have felt sad or miserable. 2 1 0   I have been so unhappy that I have been crying. 2 0 0   The thought of harming myself has occurred to me. 0  0 0   Edinburgh Postnatal Depression Scale Total 6 7 2

## 2023-07-16 ENCOUNTER — Other Ambulatory Visit: Payer: Self-pay

## 2023-07-16 ENCOUNTER — Ambulatory Visit: Payer: Medicaid Other | Admitting: Clinical

## 2023-07-16 DIAGNOSIS — Z658 Other specified problems related to psychosocial circumstances: Secondary | ICD-10-CM

## 2023-07-16 DIAGNOSIS — F4322 Adjustment disorder with anxiety: Secondary | ICD-10-CM

## 2023-07-16 DIAGNOSIS — F4321 Adjustment disorder with depressed mood: Secondary | ICD-10-CM

## 2023-07-16 NOTE — Patient Instructions (Addendum)
Center for [facility] at [facility] [address] [place], [place] [address] [phone] (main office) [phone] [facility] office)  New Parent Support Groups www.postpartum.net [email or web address]  Estate manager/land agent  (Childcare options, Early childcare development, etc.) DietDisorder.cz  [employer] Child Care Facility Search Engine  [email or web address]  [employer] [email or web address]

## 2023-07-23 ENCOUNTER — Ambulatory Visit: Payer: Medicaid Other | Admitting: General Surgery

## 2023-07-23 ENCOUNTER — Telehealth: Payer: Self-pay | Admitting: General Surgery

## 2023-07-23 ENCOUNTER — Ambulatory Visit: Payer: Self-pay | Admitting: General Surgery

## 2023-07-23 ENCOUNTER — Encounter: Payer: Self-pay | Admitting: General Surgery

## 2023-07-23 VITALS — BP 113/79 | HR 74 | Ht 65.0 in | Wt 190.0 lb

## 2023-07-23 DIAGNOSIS — K429 Umbilical hernia without obstruction or gangrene: Secondary | ICD-10-CM | POA: Diagnosis not present

## 2023-07-23 NOTE — Progress Notes (Signed)
Outpatient Surgical Follow Up  07/23/2023  Amy Greene is an 32 y.o. female.   Chief Complaint  Patient presents with   Follow-up    HPI: Amy Greene returns today to discuss her umbilical hernia.  She would like it repaired but is unsure if she is having any additional children.  When she last saw me in clinic we discussed that we would do this open given the small defect size and that she just has the potential of having additional children.  She reports no changes in her health since then.  She says that she continues to feel a bulge but is able to reduce it.  She has no obstructive symptoms and no overlying skin changes.  Past Medical History:  Diagnosis Date   Anemia    Gestational diabetes 04/03/2023    Past Surgical History:  Procedure Laterality Date   CESAREAN SECTION      Family History  Problem Relation Age of Onset   Diabetes Neg Hx    Hypertension Neg Hx    Asthma Neg Hx    Cancer Neg Hx    Heart disease Neg Hx     Social History:  reports that she has never smoked. She does not have any smokeless tobacco history on file. She reports that she does not currently use drugs. She reports that she does not drink alcohol.  Allergies: No Known Allergies  Medications reviewed.    ROS Full ROS performed and is otherwise negative other than what is stated in HPI   BP 113/79   Pulse 74   Ht 5\' 5"  (1.651 m)   Wt 190 lb (86.2 kg)   SpO2 99%   BMI 31.62 kg/m   Physical Exam Abdomen soft, nontender and nondistended.  She has a fat-containing umbilical hernia.  This is able to be reduced and the hernia defect measures approximately 1.5 cm.  Clear to auscultation bilaterally, regular rate and rhythm, alert and oriented x 3 and moving all extremities spontaneously.  Accompanied with her 39-month-old daughter     Assessment/Plan:  Amy Greene is a 32 year old with an umbilical hernia.  It is fat-containing and it does bother her intermittently.  She has 2 children but  is unsure whether she would like to have another child.  Defect measures approximately 1.5 cm.  I discussed with the patient that given she she may have another children this would put her at risk for recurrence.  However, she would still like it to be repaired.  We will plan to do this open given the small size.  I do not anticipate that she will need any mesh.  I discussed the risk, benefits alternatives of the procedure including risk of infection, bleeding, damage to underlying viscera as well as possible recurrence of the hernia.  She would like this to be done pretty soon so we will try to schedule her for December 30.   Baker Pierini, M.D. [facility]

## 2023-07-23 NOTE — Telephone Encounter (Signed)
Patient has been advised of Pre-Admission date/time, and Surgery date at [facility].  Surgery Date: 08/03/23 Preadmission Testing Date: 07/27/23 (phone 1p-4p)  Patient has been made aware to call [phone], between 1-3:00pm the day before surgery, to find out what time to arrive for surgery.

## 2023-07-23 NOTE — Patient Instructions (Addendum)
You have requested for your Umbilical Hernia be repaired. This will be scheduled with Dr. Maurine Minister at [facility]. We are looking at doing your surgery on December 30th.  Please see your (blue)pre-care sheet for information. Our surgery scheduler will call you to verify surgery date and to go over information.   You will need to arrange to be off work for 1-2 weeks but will have to have a lifting restriction of no more than 15 lbs for 6 weeks following your surgery. If you have FMLA or disability paperwork that needs filled out you may drop this off at our office or this can be faxed to [phone].     Umbilical Hernia, Adult A hernia is a bulge of tissue that pushes through an opening between muscles. An umbilical hernia happens in the abdomen, near the belly button (umbilicus). The hernia may contain tissues from the small intestine, large intestine, or fatty tissue covering the intestines (omentum). Umbilical hernias in adults tend to get worse over time, and they require surgical treatment. There are several types of umbilical hernias. You may have: A hernia located just above or below the umbilicus (indirect hernia). This is the most common type of umbilical hernia in adults. A hernia that forms through an opening formed by the umbilicus (direct hernia). A hernia that comes and goes (reducible hernia). A reducible hernia may be visible only when you strain, lift something heavy, or cough. This type of hernia can be pushed back into the abdomen (reduced). A hernia that traps abdominal tissue inside the hernia (incarcerated hernia). This type of hernia cannot be reduced. A hernia that cuts off blood flow to the tissues inside the hernia (strangulated hernia). The tissues can start to die if this happens. This type of hernia requires emergency treatment.  What are the causes? An umbilical hernia happens when tissue inside the abdomen presses on a weak area of the  abdominal muscles. What increases the risk? You may have a greater risk of this condition if you: Are obese. Have had several pregnancies. Have a buildup of fluid inside your abdomen (ascites). Have had surgery that weakens the abdominal muscles.  What are the signs or symptoms? The main symptom of this condition is a painless bulge at or near the belly button. A reducible hernia may be visible only when you strain, lift something heavy, or cough. Other symptoms may include: Dull pain. A feeling of pressure.  Symptoms of a strangulated hernia may include: Pain that gets increasingly worse. Nausea and vomiting. Pain when pressing on the hernia. Skin over the hernia becoming red or purple. Constipation. Blood in the stool.  How is this diagnosed? This condition may be diagnosed based on: A physical exam. You may be asked to cough or strain while standing. These actions increase the pressure inside your abdomen and force the hernia through the opening in your muscles. Your health care provider may try to reduce the hernia by pressing on it. Your symptoms and medical history.  How is this treated? Surgery is the only treatment for an umbilical hernia. Surgery for a strangulated hernia is done as soon as possible. If you have a small hernia that is not incarcerated, you may need to lose weight before having surgery. Follow these instructions at home: Lose weight, if told by your health care provider. Do not try to push the hernia back in. Watch your hernia for any changes in color or size. Tell your health care provider if  any changes occur. You may need to avoid activities that increase pressure on your hernia. Do not lift anything that is heavier than 10 lb (4.5 kg) until your health care provider says that this is safe. Take over-the-counter and prescription medicines only as told by your health care provider. Keep all follow-up visits as told by your health care provider. This is  important. Contact a health care provider if: Your hernia gets larger. Your hernia becomes painful. Get help right away if: You develop sudden, severe pain near the area of your hernia. You have pain as well as nausea or vomiting. You have pain and the skin over your hernia changes color. You develop a fever. This information is not intended to replace advice given to you by your health care provider. Make sure you discuss any questions you have with your health care provider. Document Released: 12/21/2015 Document Revised: 03/23/2016 Document Reviewed: 12/21/2015 Elsevier Interactive Patient Education  [employer].

## 2023-07-27 ENCOUNTER — Encounter
Admission: RE | Admit: 2023-07-27 | Discharge: 2023-07-27 | Disposition: A | Discharge status: Home / Self Care | Payer: Medicaid Other | Source: Ambulatory Visit | Attending: General Surgery | Admitting: General Surgery

## 2023-07-27 ENCOUNTER — Other Ambulatory Visit: Payer: Self-pay

## 2023-07-27 VITALS — Ht 65.0 in | Wt 190.0 lb

## 2023-07-27 DIAGNOSIS — Z01812 Encounter for preprocedural laboratory examination: Secondary | ICD-10-CM

## 2023-07-27 HISTORY — DX: Umbilical hernia without obstruction or gangrene: K42.9

## 2023-07-27 NOTE — Patient Instructions (Addendum)
Your procedure is scheduled on:   Monday December 30  Report to the Registration Desk on the 1st floor of the [facility] To find out your arrival time, please call [phone] between 1PM - 3PM on:  Friday December 27  If your arrival time is 6:00 am, do not arrive before that time as the [facility] entrance doors do not open until 6:00 am.  REMEMBER: Instructions that are not followed completely may result in serious medical risk, up to and including death; or upon the discretion of your surgeon and anesthesiologist your surgery may need to be rescheduled.  Do not eat food after midnight the night before surgery.  No gum chewing or hard candies.  One week prior to surgery: Starting Monday December 23  Stop Anti-inflammatories (NSAIDS) such as Advil, Aleve, Ibuprofen, Motrin, Naproxen, Naprosyn and Aspirin based products such as Excedrin, Goody's Powder, BC Powder. Stop ANY OVER THE COUNTER supplements until after surgery.  You may however, continue to take Tylenol if needed for pain up until the day of surgery.  No Alcohol for 24 hours before or after surgery.  No Smoking including e-cigarettes for 24 hours before surgery.  No chewable tobacco products for at least 6 hours before surgery.  No nicotine patches on the day of surgery.  Do not use any "recreational" drugs for at least a week (preferably 2 weeks) before your surgery.  Please be advised that the combination of cocaine and anesthesia may have negative outcomes, up to and including death. If you test positive for cocaine, your surgery will be cancelled.  On the morning of surgery brush your teeth with toothpaste and water, you may rinse your mouth with mouthwash if you wish. Do not swallow any toothpaste or mouthwash.  Use CHG Soap or wipes as directed on instruction sheet.  Do not wear jewelry, make-up, hairpins, clips or nail polish.  For welded (permanent) jewelry: bracelets, anklets, waist bands, etc.  Please  have this removed prior to surgery.  If it is not removed, there is a chance that hospital personnel will need to cut it off on the day of surgery.  Do not wear lotions, powders, or perfumes.   Do not shave body hair from the neck down 48 hours before surgery.  Contact lenses, hearing aids and dentures may not be worn into surgery.  Do not bring valuables to the hospital. [facility] is not responsible for any missing/lost belongings or valuables.   Notify your doctor if there is any change in your medical condition (cold, fever, infection).  Wear comfortable clothing (specific to your surgery type) to the hospital.  After surgery, you can help prevent lung complications by doing breathing exercises.  Take deep breaths and cough every 1-2 hours.  When coughing or sneezing, hold a pillow firmly against your incision with both hands. This is called "splinting." Doing this helps protect your incision. It also decreases belly discomfort.   If you are being discharged the day of surgery, you will not be allowed to drive home. You will need a responsible individual to drive you home and stay with you for 24 hours after surgery.   If you are taking public transportation, you will need to have a responsible individual with you.  Please call the Pre-admissions Testing Dept. at [phone] if you have any questions about these instructions.  Surgery Visitation Policy:  Patients having surgery or a procedure may have two visitors.  Children under the age of 42 must  have an adult with them who is not the patient.          Preparing for Surgery with CHLORHEXIDINE GLUCONATE (CHG) Soap  Chlorhexidine Gluconate (CHG) Soap  o An antiseptic cleaner that kills germs and bonds with the skin to continue killing germs even after washing  o Used for showering the night before surgery and morning of surgery  Before surgery, you can play an important role by reducing the number of germs on  your skin.  CHG (Chlorhexidine gluconate) soap is an antiseptic cleanser which kills germs and bonds with the skin to continue killing germs even after washing.  Please do not use if you have an allergy to CHG or antibacterial soaps. If your skin becomes reddened/irritated stop using the CHG.  1. Shower the NIGHT BEFORE SURGERY and the MORNING OF SURGERY with CHG soap.  2. If you choose to wash your hair, wash your hair first as usual with your normal shampoo.  3. After shampooing, rinse your hair and body thoroughly to remove the shampoo.  4. Use CHG as you would any other liquid soap. You can apply CHG directly to the skin and wash gently with a scrungie or a clean washcloth.  5. Apply the CHG soap to your body only from the neck down. Do not use on open wounds or open sores. Avoid contact with your eyes, ears, mouth, and genitals (private parts). Wash face and genitals (private parts) with your normal soap.  6. Wash thoroughly, paying special attention to the area where your surgery will be performed.  7. Thoroughly rinse your body with warm water.  8. Do not shower/wash with your normal soap after using and rinsing off the CHG soap.  9. Pat yourself dry with a clean towel.  10. Wear clean pajamas to bed the night before surgery.  12. Place clean sheets on your bed the night of your first shower and do not sleep with pets.  13. Shower again with the CHG soap on the day of surgery prior to arriving at the hospital.  14. Do not apply any deodorants/lotions/powders.  15. Please wear clean clothes to the hospital.
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
## 2023-08-03 ENCOUNTER — Encounter
Admission: RE | Disposition: A | Discharge status: Home / Self Care | Payer: Self-pay | Source: Home / Self Care | Attending: General Surgery

## 2023-08-03 ENCOUNTER — Ambulatory Visit: Payer: Medicaid Other | Admitting: Certified Registered"

## 2023-08-03 ENCOUNTER — Ambulatory Visit
Admission: RE | Admit: 2023-08-03 | Discharge: 2023-08-03 | Disposition: A | Discharge status: Home / Self Care | Payer: Medicaid Other | Attending: General Surgery | Admitting: General Surgery

## 2023-08-03 ENCOUNTER — Other Ambulatory Visit: Payer: Self-pay

## 2023-08-03 ENCOUNTER — Encounter: Payer: Self-pay | Admitting: General Surgery

## 2023-08-03 DIAGNOSIS — Z01812 Encounter for preprocedural laboratory examination: Secondary | ICD-10-CM

## 2023-08-03 DIAGNOSIS — Z6831 Body mass index (BMI) 31.0-31.9, adult: Secondary | ICD-10-CM | POA: Insufficient documentation

## 2023-08-03 DIAGNOSIS — K429 Umbilical hernia without obstruction or gangrene: Secondary | ICD-10-CM | POA: Insufficient documentation

## 2023-08-03 HISTORY — PX: UMBILICAL HERNIA REPAIR: SHX196

## 2023-08-03 LAB — POCT PREGNANCY, URINE: Preg Test, Ur: NEGATIVE

## 2023-08-03 SURGERY — REPAIR, HERNIA, UMBILICAL, ADULT
Anesthesia: General | Site: Abdomen

## 2023-08-03 MED ORDER — 0.9 % SODIUM CHLORIDE (POUR BTL) OPTIME
TOPICAL | Status: DC | PRN
  Administered 2023-08-03: 120 mL

## 2023-08-03 MED ORDER — ROCURONIUM BROMIDE 100 MG/10ML IV SOLN
INTRAVENOUS | Status: DC | PRN
  Administered 2023-08-03: 40 mg via INTRAVENOUS

## 2023-08-03 MED ORDER — ACETAMINOPHEN 10 MG/ML IV SOLN: 1000.0000 mg | Freq: Once | INTRAVENOUS | Status: DC | PRN

## 2023-08-03 MED ORDER — FENTANYL CITRATE (PF) 100 MCG/2ML IJ SOLN
INTRAMUSCULAR | Status: AC
  Filled 2023-08-03: qty 2

## 2023-08-03 MED ORDER — PROPOFOL 10 MG/ML IV BOLUS
INTRAVENOUS | Status: AC
  Filled 2023-08-03: qty 20

## 2023-08-03 MED ORDER — ORAL CARE MOUTH RINSE: 15.0000 mL | Freq: Once | OROMUCOSAL | Status: AC

## 2023-08-03 MED ORDER — CHLORHEXIDINE GLUCONATE 0.12 % MT SOLN
15.0000 mL | Freq: Once | OROMUCOSAL | Status: AC
  Administered 2023-08-03: 15 mL via OROMUCOSAL

## 2023-08-03 MED ORDER — PROPOFOL 10 MG/ML IV BOLUS
INTRAVENOUS | Status: DC | PRN
  Administered 2023-08-03: 40 mg via INTRAVENOUS
  Administered 2023-08-03: 160 mg via INTRAVENOUS

## 2023-08-03 MED ORDER — DEXAMETHASONE SODIUM PHOSPHATE 10 MG/ML IJ SOLN
INTRAMUSCULAR | Status: DC | PRN
  Administered 2023-08-03: 10 mg via INTRAVENOUS

## 2023-08-03 MED ORDER — CHLORHEXIDINE GLUCONATE 0.12 % MT SOLN
OROMUCOSAL | Status: AC
  Filled 2023-08-03: qty 15

## 2023-08-03 MED ORDER — SUGAMMADEX SODIUM 200 MG/2ML IV SOLN
INTRAVENOUS | Status: DC | PRN
  Administered 2023-08-03: 200 mg via INTRAVENOUS

## 2023-08-03 MED ORDER — LIDOCAINE HCL (CARDIAC) PF 100 MG/5ML IV SOSY
PREFILLED_SYRINGE | INTRAVENOUS | Status: DC | PRN
  Administered 2023-08-03: 80 mg via INTRAVENOUS

## 2023-08-03 MED ORDER — CEFAZOLIN SODIUM-DEXTROSE 2-4 GM/100ML-% IV SOLN
2.0000 g | INTRAVENOUS | Status: AC
  Administered 2023-08-03: 2 g via INTRAVENOUS

## 2023-08-03 MED ORDER — CHLORHEXIDINE GLUCONATE CLOTH 2 % EX PADS
6.0000 | MEDICATED_PAD | Freq: Once | CUTANEOUS | Status: AC
  Administered 2023-08-03: 6 via TOPICAL

## 2023-08-03 MED ORDER — MIDAZOLAM HCL 2 MG/2ML IJ SOLN
INTRAMUSCULAR | Status: AC
  Filled 2023-08-03: qty 2

## 2023-08-03 MED ORDER — MIDAZOLAM HCL 2 MG/2ML IJ SOLN
INTRAMUSCULAR | Status: DC | PRN
  Administered 2023-08-03: 2 mg via INTRAVENOUS

## 2023-08-03 MED ORDER — CEFAZOLIN SODIUM-DEXTROSE 2-4 GM/100ML-% IV SOLN
INTRAVENOUS | Status: AC
  Filled 2023-08-03: qty 100

## 2023-08-03 MED ORDER — ONDANSETRON HCL 4 MG/2ML IJ SOLN: 4.0000 mg | Freq: Once | INTRAMUSCULAR | Status: DC | PRN

## 2023-08-03 MED ORDER — FENTANYL CITRATE (PF) 100 MCG/2ML IJ SOLN
25.0000 ug | INTRAMUSCULAR | Status: DC | PRN
  Administered 2023-08-03 (×4): 25 ug via INTRAVENOUS

## 2023-08-03 MED ORDER — BUPIVACAINE LIPOSOME 1.3 % IJ SUSP
INTRAMUSCULAR | Status: DC | PRN
  Administered 2023-08-03: 10 mL

## 2023-08-03 MED ORDER — OXYCODONE HCL 5 MG PO TABS
5.0000 mg | ORAL_TABLET | Freq: Once | ORAL | Status: AC | PRN
  Administered 2023-08-03: 5 mg via ORAL

## 2023-08-03 MED ORDER — OXYCODONE HCL 5 MG PO TABS
ORAL_TABLET | ORAL | Status: AC
  Filled 2023-08-03: qty 1

## 2023-08-03 MED ORDER — LACTATED RINGERS IV SOLN: INTRAVENOUS | Status: AC

## 2023-08-03 MED ORDER — BUPIVACAINE-EPINEPHRINE 0.5% -1:200000 IJ SOLN
INTRAMUSCULAR | Status: DC | PRN
  Administered 2023-08-03: 10 mL

## 2023-08-03 MED ORDER — OXYCODONE HCL 5 MG/5ML PO SOLN: 5.0000 mg | Freq: Once | ORAL | Status: AC | PRN

## 2023-08-03 MED ORDER — BUPIVACAINE-EPINEPHRINE (PF) 0.5% -1:200000 IJ SOLN
INTRAMUSCULAR | Status: AC
  Filled 2023-08-03: qty 10

## 2023-08-03 MED ORDER — OXYCODONE HCL 5 MG PO TABS: 5.0000 mg | ORAL_TABLET | Freq: Four times a day (QID) | ORAL | 0 refills | Status: DC | PRN

## 2023-08-03 MED ORDER — LACTATED RINGERS IV SOLN: INTRAVENOUS | Status: DC

## 2023-08-03 MED ORDER — ONDANSETRON HCL 4 MG/2ML IJ SOLN
INTRAMUSCULAR | Status: DC | PRN
  Administered 2023-08-03: 4 mg via INTRAVENOUS

## 2023-08-03 MED ORDER — FENTANYL CITRATE (PF) 100 MCG/2ML IJ SOLN
INTRAMUSCULAR | Status: DC | PRN
  Administered 2023-08-03 (×3): 50 ug via INTRAVENOUS

## 2023-08-03 MED ORDER — BUPIVACAINE LIPOSOME 1.3 % IJ SUSP
INTRAMUSCULAR | Status: AC
  Filled 2023-08-03: qty 10

## 2023-08-03 SURGICAL SUPPLY — 33 items
BLADE SURG 10 STRL SS SAFETY (BLADE) IMPLANT
BLADE SURG 15 STRL LF DISP TIS (BLADE) ×1 IMPLANT
CHLORAPREP W/TINT 26 (MISCELLANEOUS) ×1 IMPLANT
DERMABOND ADVANCED .7 DNX12 (GAUZE/BANDAGES/DRESSINGS) IMPLANT
DRAPE LAPAROTOMY 100X77 ABD (DRAPES) ×1 IMPLANT
DRSG OPSITE POSTOP 4X10 (GAUZE/BANDAGES/DRESSINGS) IMPLANT
DRSG OPSITE POSTOP 4X8 (GAUZE/BANDAGES/DRESSINGS) IMPLANT
ELECT BLADE 6.5 EXT (BLADE) IMPLANT
ELECT CAUTERY BLADE 6.4 (BLADE) ×1 IMPLANT
ELECT REM PT RETURN 9FT ADLT (ELECTROSURGICAL) ×1
ELECTRODE REM PT RTRN 9FT ADLT (ELECTROSURGICAL) ×1 IMPLANT
GLOVE BIOGEL PI IND STRL 7.5 (GLOVE) ×1 IMPLANT
GLOVE SURG SYN 7.0 (GLOVE) ×2
GLOVE SURG SYN 7.0 PF PI (GLOVE) ×1 IMPLANT
GOWN STRL REUS W/ TWL LRG LVL3 (GOWN DISPOSABLE) ×2 IMPLANT
KIT TURNOVER KIT A (KITS) ×1 IMPLANT
LABEL OR SOLS (LABEL) ×1 IMPLANT
MANIFOLD NEPTUNE II (INSTRUMENTS) ×1 IMPLANT
NDL HYPO 22X1.5 SAFETY MO (MISCELLANEOUS) ×1 IMPLANT
NEEDLE HYPO 22X1.5 SAFETY MO (MISCELLANEOUS) ×1
NS IRRIG 500ML POUR BTL (IV SOLUTION) ×1 IMPLANT
PACK BASIN MINOR ARMC (MISCELLANEOUS) ×1 IMPLANT
SPONGE T-LAP 18X18 ~~LOC~~+RFID (SPONGE) ×1 IMPLANT
STAPLER SKIN PROX 35W (STAPLE) IMPLANT
SUT ETHIBOND 0 MO6 C/R (SUTURE) IMPLANT
SUT MNCRL 4-0 27XMFL (SUTURE) ×1
SUT PDS PLUS AB 0 CT-2 (SUTURE) ×2 IMPLANT
SUT VIC AB 3-0 SH 27X BRD (SUTURE) ×1 IMPLANT
SUTURE MNCRL 4-0 27XMF (SUTURE) ×1 IMPLANT
SYR 10ML LL (SYRINGE) ×1 IMPLANT
SYR 20ML LL LF (SYRINGE) IMPLANT
TRAP FLUID SMOKE EVACUATOR (MISCELLANEOUS) ×1 IMPLANT
WATER STERILE IRR 500ML POUR (IV SOLUTION) ×1 IMPLANT

## 2023-08-03 NOTE — Transfer of Care (Signed)
Immediate Anesthesia Transfer of Care Note  Patient: Amy Greene  Procedure(s) Performed: HERNIA REPAIR UMBILICAL ADULT, open, no mesh  Patient Location: PACU  Anesthesia Type:General  Level of Consciousness: awake, alert , and oriented  Airway & Oxygen Therapy: Patient Spontanous Breathing  Post-op Assessment: Report given to RN, Post -op Vital signs reviewed and stable, and Patient moving all extremities X 4  Post vital signs: Reviewed and stable  Last Vitals:  Vitals Value Taken Time  BP 120/85 08/03/23 1120  Temp    Pulse 78 08/03/23 1122  Resp 23 08/03/23 1122  SpO2 97 % 08/03/23 1122  Vitals shown include unfiled device data.  Last Pain:  Vitals:   08/03/23 0841  TempSrc: Oral  PainSc: 0-No pain      Patients Stated Pain Goal: 0 (08/03/23 0841)  Complications: No notable events documented.

## 2023-08-03 NOTE — H&P (Signed)
No changes to below H and P, proceed with open umbilical hernia repair without mesh. All R/b/a discussed with patient and she agrees to proceed.    07/23/2023   Amy Greene is an 32 y.o. female.       Chief Complaint  Patient presents with   Follow-up      HPI: Amy Greene returns today to discuss her umbilical hernia.  She would like it repaired but is unsure if she is having any additional children.  When she last saw me in clinic we discussed that we would do this open given the small defect size and that she just has the potential of having additional children.  She reports no changes in her health since then.  She says that she continues to feel a bulge but is able to reduce it.  She has no obstructive symptoms and no overlying skin changes.       Past Medical History:  Diagnosis Date   Anemia     Gestational diabetes 04/03/2023               Past Surgical History:  Procedure Laterality Date   CESAREAN SECTION                   Family History  Problem Relation Age of Onset   Diabetes Neg Hx     Hypertension Neg Hx     Asthma Neg Hx     Cancer Neg Hx     Heart disease Neg Hx            Social History:  reports that she has never smoked. She does not have any smokeless tobacco history on file. She reports that she does not currently use drugs. She reports that she does not drink alcohol.   Allergies:  Allergies  No Known Allergies     Medications reviewed.       ROS Full ROS performed and is otherwise negative other than what is stated in HPI     BP 113/79   Pulse 74   Ht 5\' 5"  (1.651 m)   Wt 190 lb (86.2 kg)   SpO2 99%   BMI 31.62 kg/m    Physical Exam Abdomen soft, nontender and nondistended.  She has a fat-containing umbilical hernia.  This is able to be reduced and the hernia defect measures approximately 1.5 cm.  Clear to auscultation bilaterally, regular rate and rhythm, alert and oriented x 3 and moving all extremities spontaneously.   Accompanied with her 78-month-old daughter         Assessment/Plan:   Amy Greene is a 32 year old with an umbilical hernia.  It is fat-containing and it does bother her intermittently.  She has 2 children but is unsure whether she would like to have another child.  Defect measures approximately 1.5 cm.  I discussed with the patient that given she she may have another children this would put her at risk for recurrence.  However, she would still like it to be repaired.  We will plan to do this open given the small size.  I do not anticipate that she will need any mesh.  I discussed the risk, benefits alternatives of the procedure including risk of infection, bleeding, damage to underlying viscera as well as possible recurrence of the hernia.  She would like this to be done pretty soon so we will try to schedule her for December 30.     Baker Pierini, M.D. [facility]

## 2023-08-03 NOTE — Anesthesia Procedure Notes (Signed)
Procedure Name: Intubation Date/Time: 08/03/2023 10:03 AM  Performed by: Lanell Matar, CRNAPre-anesthesia Checklist: Patient identified, Emergency Drugs available, Suction available and Patient being monitored Patient Re-evaluated:Patient Re-evaluated prior to induction Oxygen Delivery Method: Circle System Utilized Preoxygenation: Pre-oxygenation with 100% oxygen Induction Type: IV induction Ventilation: Mask ventilation without difficulty Laryngoscope Size: McGrath and 4 Grade View: Grade I Tube type: Oral Tube size: 7.0 mm Number of attempts: 1 Airway Equipment and Method: Stylet and Oral airway Placement Confirmation: ETT inserted through vocal cords under direct vision, positive ETCO2 and breath sounds checked- equal and bilateral Secured at: 22 cm Tube secured with: Tape Dental Injury: Teeth and Oropharynx as per pre-operative assessment

## 2023-08-03 NOTE — Anesthesia Preprocedure Evaluation (Addendum)
Anesthesia Evaluation  Patient identified by MRN, date of birth, ID band Patient awake    Reviewed: Allergy & Precautions, NPO status , Patient's Chart, lab work & pertinent test results  History of Anesthesia Complications Negative for: history of anesthetic complications  Airway Mallampati: I   Neck ROM: Full    Dental no notable dental hx.    Pulmonary neg pulmonary ROS   Pulmonary exam normal breath sounds clear to auscultation       Cardiovascular Exercise Tolerance: Good negative cardio ROS Normal cardiovascular exam Rhythm:Regular Rate:Normal     Neuro/Psych negative neurological ROS     GI/Hepatic negative GI ROS,,,  Endo/Other  diabetes (hx gestational, no longer diabetic)  Obesity   Renal/GU      Musculoskeletal   Abdominal   Peds  Hematology  (+) Blood dyscrasia, anemia   Anesthesia Other Findings   Reproductive/Obstetrics (+) Breast feeding                              Anesthesia Physical Anesthesia Plan  ASA: 2  Anesthesia Plan: General   Post-op Pain Management:    Induction: Intravenous  PONV Risk Score and Plan: 3 and Ondansetron, Dexamethasone and Treatment may vary due to age or medical condition  Airway Management Planned: Oral ETT  Additional Equipment:   Intra-op Plan:   Post-operative Plan: Extubation in OR  Informed Consent: I have reviewed the patients History and Physical, chart, labs and discussed the procedure including the risks, benefits and alternatives for the proposed anesthesia with the patient or authorized representative who has indicated his/her understanding and acceptance.     Dental advisory given  Plan Discussed with: CRNA  Anesthesia Plan Comments: (Patient consented for risks of anesthesia including but not limited to:  - adverse reactions to medications - damage to eyes, teeth, lips or other oral mucosa - nerve damage due  to positioning  - sore throat or hoarseness - damage to heart, brain, nerves, lungs, other parts of body or loss of life  Informed patient about role of CRNA in peri- and intra-operative care.  Patient voiced understanding.)       Anesthesia Quick Evaluation

## 2023-08-03 NOTE — Op Note (Signed)
Operative note Preoperative diagnosis: Umbilical hernia Postoperative diagnosis: Umbilical hernia measuring approximately 3 cm Surgeon: Baker Pierini, MD EBL: 5 cc Procedure: Umbilical hernia repair  After informed consent was obtained the patient was brought to the operating room placed supine on the operating table.  General endotracheal anesthesia was induced and then her abdomen was then prepped and draped in the usual sterile fashion.  A surgical timeout was called identifying correct patient, site, side and procedure.  An infraumbilical curvilinear incision was made and taken down through the subcutaneous tissue with Bovie cautery.  The hernia sac was encountered.  This was separated from the umbilical stalk.  The hernia sac was opened and there was presence of preperitoneal fat.  The hernia sac was dissected off the fascial edges circumferentially.  The fascial edges were cleared off identifying healthy fascial tissue.  The hernia that defect measured approximately 3 cm.  The hernia defect was then closed with 0 Ethibond suture using a vest over pants technique.  The subcutaneous tissue was then irrigated with warm saline.  The umbilicus was then tacked to the fascia using a 3-0 Vicryl.  The subcutaneous tissue was then closed with a 3-0 Vicryl.  The deep dermal layer was closed with 3-0 Vicryl.  The skin was closed with 4-0 Monocryl and dressed with surgical glue.  Prior to termination of the procedure all sponge instrument counts were correct x 2.  The patient was then awoken from general endotracheal anesthesia and transferred the PACU in good condition

## 2023-08-03 NOTE — Anesthesia Postprocedure Evaluation (Signed)
Anesthesia Post Note  Patient: Secora Villegas  Procedure(s) Performed: HERNIA REPAIR UMBILICAL ADULT 3cm (Abdomen)  Patient location during evaluation: PACU Anesthesia Type: General Level of consciousness: awake and alert, oriented and patient cooperative Pain management: pain level controlled Vital Signs Assessment: post-procedure vital signs reviewed and stable Respiratory status: spontaneous breathing, nonlabored ventilation and respiratory function stable Cardiovascular status: blood pressure returned to baseline and stable Postop Assessment: adequate PO intake Anesthetic complications: no   No notable events documented.   Last Vitals:  Vitals:   08/03/23 1210 08/03/23 1222  BP: 122/82 121/73  Pulse: 69 73  Resp: 13 16  Temp: 36.6 C 36.5 C  SpO2: 97% 100%    Last Pain:  Vitals:   08/03/23 1222  TempSrc: Temporal  PainSc: 7                  Reed Breech

## 2023-08-04 ENCOUNTER — Encounter: Payer: Self-pay | Admitting: General Surgery

## 2023-08-11 ENCOUNTER — Telehealth: Payer: Self-pay | Admitting: General Surgery

## 2023-08-11 ENCOUNTER — Ambulatory Visit: Payer: Medicaid Other | Admitting: Clinical

## 2023-08-11 ENCOUNTER — Other Ambulatory Visit: Payer: Self-pay | Admitting: General Surgery

## 2023-08-11 DIAGNOSIS — K429 Umbilical hernia without obstruction or gangrene: Secondary | ICD-10-CM

## 2023-08-11 DIAGNOSIS — Z658 Other specified problems related to psychosocial circumstances: Secondary | ICD-10-CM

## 2023-08-11 MED ORDER — OXYCODONE HCL 5 MG PO TABS: 5.0000 mg | ORAL_TABLET | Freq: Three times a day (TID) | ORAL | 0 refills | Status: AC | PRN
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
## 2023-08-13 ENCOUNTER — Encounter: Payer: Self-pay | Admitting: Family Medicine

## 2023-08-13 ENCOUNTER — Encounter: Payer: Self-pay | Admitting: General Surgery

## 2023-08-13 ENCOUNTER — Ambulatory Visit (INDEPENDENT_AMBULATORY_CARE_PROVIDER_SITE_OTHER): Payer: Medicaid Other | Admitting: General Surgery

## 2023-08-13 VITALS — BP 117/78 | HR 98 | Temp 98.2°F | Ht 65.0 in | Wt 191.0 lb

## 2023-08-13 DIAGNOSIS — Z09 Encounter for follow-up examination after completed treatment for conditions other than malignant neoplasm: Secondary | ICD-10-CM | POA: Diagnosis not present

## 2023-08-13 DIAGNOSIS — K429 Umbilical hernia without obstruction or gangrene: Secondary | ICD-10-CM

## 2023-08-13 NOTE — Patient Instructions (Signed)
 Follow-up with our office as needed. ? ?Please call and ask to speak with a nurse if you develop questions or concerns. ? ? ?GENERAL POST-OPERATIVE ?PATIENT INSTRUCTIONS  ? ?WOUND CARE INSTRUCTIONS:  Keep a dry clean dressing on the wound if there is drainage. The initial bandage may be removed after 24 hours.  Once the wound has quit draining you may leave it open to air.  If clothing rubs against the wound or causes irritation and the wound is not draining you may cover it with a dry dressing during the daytime.  Try to keep the wound dry and avoid ointments on the wound unless directed to do so.  If the wound becomes bright red and painful or starts to drain infected material that is not clear, please contact your physician immediately.  If the wound is mildly pink and has a thick firm ridge underneath it, this is normal, and is referred to as a healing ridge.  This will resolve over the next 4-6 weeks. ? ?BATHING: ?You may shower if you have been informed of this by your surgeon. However, Please do not submerge in a tub, hot tub, or pool until incisions are completely sealed or have been told by your surgeon that you may do so. ? ?DIET:  You may eat any foods that you can tolerate.  It is a good idea to eat a high fiber diet and take in plenty of fluids to prevent constipation.  If you do become constipated you may want to take a mild laxative or take ducolax tablets on a daily basis until your bowel habits are regular.  Constipation can be very uncomfortable, along with straining, after recent surgery. ? ?ACTIVITY:  You are encouraged to cough and deep breath or use your incentive spirometer if you were given one, every 15-30 minutes when awake.  This will help prevent respiratory complications and low grade fevers post-operatively if you had a general anesthetic.  You may want to hug a pillow when coughing and sneezing to add additional support to the surgical area, if you had abdominal or chest surgery, which  will decrease pain during these times.  You are encouraged to walk and engage in light activity for the next two weeks.  You should not lift more than 20 pounds for 6 weeks total after surgery as it could put you at increased risk for complications.  Twenty pounds is roughly equivalent to a plastic bag of groceries. At that time- Listen to your body when lifting, if you have pain when lifting, stop and then try again in a few days. Soreness after doing exercises or activities of daily living is normal as you get back in to your normal routine. ? ?MEDICATIONS:  Try to take narcotic medications and anti-inflammatory medications, such as tylenol , ibuprofen , naprosyn, etc., with food.  This will minimize stomach upset from the medication.  Should you develop nausea and vomiting from the pain medication, or develop a rash, please discontinue the medication and contact your physician.  You should not drive, make important decisions, or operate machinery when taking narcotic pain medication. ? ?SUNBLOCK ?Use sun block to incision area over the next year if this area will be exposed to sun. This helps decrease scarring and will allow you avoid a permanent darkened area over your incision. ? ?QUESTIONS:  Please feel free to call our office if you have any questions, and we will be glad to assist you. [phone] ? ? ?
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
## 2023-08-24 ENCOUNTER — Telehealth: Payer: Self-pay | Admitting: *Deleted

## 2023-08-24 NOTE — Telephone Encounter (Signed)
Faxed FMLA to [employer] at [phone]

## 2023-08-31 ENCOUNTER — Encounter: Payer: Self-pay | Admitting: Family Medicine
